# Patient Record
Sex: Female | Born: 2000 | Race: Black or African American | Hispanic: No | Marital: Single | Smoking: Never smoker
Health system: Southern US, Community
[De-identification: ages and names within clinical notes are randomized; demographics above are authoritative.]

## PROBLEM LIST (undated history)

## (undated) DIAGNOSIS — F419 Anxiety disorder, unspecified: Secondary | ICD-10-CM

## (undated) DIAGNOSIS — R519 Headache, unspecified: Secondary | ICD-10-CM

## (undated) HISTORY — PX: NO PAST SURGERIES: SHX2092

## (undated) HISTORY — DX: Headache, unspecified: R51.9

## (undated) HISTORY — PX: OTHER SURGICAL HISTORY: SHX169

## (undated) HISTORY — DX: Anxiety disorder, unspecified: F41.9

---
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
## 2021-02-06 DIAGNOSIS — N760 Acute vaginitis: Secondary | ICD-10-CM | POA: Diagnosis not present

## 2021-03-22 DIAGNOSIS — Z0000 Encounter for general adult medical examination without abnormal findings: Secondary | ICD-10-CM | POA: Diagnosis not present

## 2021-03-22 DIAGNOSIS — Z025 Encounter for examination for participation in sport: Secondary | ICD-10-CM | POA: Diagnosis not present

## 2021-03-23 ENCOUNTER — Inpatient Hospital Stay (HOSPITAL_COMMUNITY)
Admission: AD | Admit: 2021-03-23 | Discharge: 2021-03-23 | Disposition: A | Discharge status: Home / Self Care | Payer: Medicaid Other | Attending: Obstetrics and Gynecology | Admitting: Obstetrics and Gynecology

## 2021-03-23 ENCOUNTER — Other Ambulatory Visit: Payer: Self-pay

## 2021-03-23 ENCOUNTER — Ambulatory Visit (HOSPITAL_COMMUNITY)
Admission: EM | Admit: 2021-03-23 | Discharge: 2021-03-23 | Disposition: A | Discharge status: Home / Self Care | Payer: Medicaid Other | Attending: Medical Oncology | Admitting: Medical Oncology

## 2021-03-23 ENCOUNTER — Encounter (HOSPITAL_COMMUNITY): Payer: Self-pay

## 2021-03-23 DIAGNOSIS — Z3A00 Weeks of gestation of pregnancy not specified: Secondary | ICD-10-CM | POA: Insufficient documentation

## 2021-03-23 DIAGNOSIS — Z3201 Encounter for pregnancy test, result positive: Secondary | ICD-10-CM

## 2021-03-23 DIAGNOSIS — Z113 Encounter for screening for infections with a predominantly sexual mode of transmission: Secondary | ICD-10-CM | POA: Diagnosis present

## 2021-03-23 DIAGNOSIS — Z3401 Encounter for supervision of normal first pregnancy, first trimester: Secondary | ICD-10-CM | POA: Diagnosis present

## 2021-03-23 DIAGNOSIS — Z7251 High risk heterosexual behavior: Secondary | ICD-10-CM | POA: Diagnosis not present

## 2021-03-23 DIAGNOSIS — Z3200 Encounter for pregnancy test, result unknown: Secondary | ICD-10-CM

## 2021-03-23 LAB — POC URINE PREG, ED: Preg Test, Ur: POSITIVE — AB

## 2021-03-23 NOTE — Discharge Instructions (Signed)
[facility]   [facility]             [address], [place], [place] [address] [phone]  [facility]                                                             [address], [address], [place], [place], [address] [phone]  [facility]                                    [address], [address], [place], [place], [address] [phone]  [facility] [place] [address], [address], [place], [place], [address] [phone]  [facility]                                 [address], [place], [place], [address] [phone]  [facility]                                    [address], [place], [place], [address] [phone]  [facility] [address], [address], [place], [place], [address]                              [facility] [address], [place], [place], [address] [phone]  [facility]         Phone: [phone]  [facility]      Phone: [phone]   [facility]      Phone: [phone]  [facility]         Phone: [phone]   [facility]    Phone: [phone]  [facility]      Phone: [phone]  [facility]     Phone: [phone]  [facility]        Phone: [phone]  [facility]      Phone: [phone]  

## 2021-03-23 NOTE — MAU Provider Note (Addendum)
Chief Complaint: Possible Pregnancy   Event Date/Time   First Provider Initiated Contact with Patient 03/23/21 1228      SUBJECTIVE HPI: Joy Dyer is a 20 y.o. female who presents to maternity admissions for pregnancy confirmation.  She had 2 positive pregnancy test at home.  She denies vaginal bleeding or abdominal pain today. This was an unplanned pregnancy and she is not sure on her next steps.  Would like resources for OB/GYN in the region.  No past medical history on file. No past surgical history on file. Social History   Socioeconomic History   Marital status: Single    Spouse name: Not on file   Number of children: Not on file   Years of education: Not on file   Highest education level: Not on file  Occupational History   Not on file  Tobacco Use   Smoking status: Never   Smokeless tobacco: Not on file  Substance and Sexual Activity   Alcohol use: No   Drug use: Not on file   Sexual activity: Not on file  Other Topics Concern   Not on file  Social History Narrative   Not on file   Social Determinants of Health   Financial Resource Strain: Not on file  Food Insecurity: Not on file  Transportation Needs: Not on file  Physical Activity: Not on file  Stress: Not on file  Social Connections: Not on file  Intimate Partner Violence: Not on file   No current facility-administered medications on file prior to encounter.   No current outpatient medications on file prior to encounter.   No Known Allergies  ROS:  Review of Systems  Respiratory:  Negative for shortness of breath.   Cardiovascular:  Negative for chest pain.  Gastrointestinal:  Negative for abdominal pain, nausea and vomiting.  Genitourinary:  Negative for pelvic pain and vaginal bleeding.  Neurological:  Negative for dizziness and light-headedness.   I have reviewed patient's Past Medical Hx, Surgical Hx, Family Hx, Social Hx, medications and allergies.   Physical Exam  Patient Vitals for the  past 24 hrs:  BP Temp Temp src Pulse Resp SpO2 Height Weight  03/23/21 1205 117/74 98.7 F (37.1 C) Oral 67 16 100 % 5' (1.524 m) 46.8 kg   Physical Exam Constitutional:      General: She is not in acute distress.    Appearance: Normal appearance. She is not ill-appearing.  HENT:     Head: Normocephalic and atraumatic.     Mouth/Throat:     Mouth: Mucous membranes are moist.  Eyes:     Extraocular Movements: Extraocular movements intact.  Cardiovascular:     Pulses: Normal pulses.  Abdominal:     General: There is no distension.     Palpations: Abdomen is soft.     Tenderness: There is no abdominal tenderness.  Skin:    General: Skin is warm and dry.  Neurological:     Mental Status: She is alert.  Psychiatric:        Mood and Affect: Mood normal.        Behavior: Behavior normal.    MDM Patient denies any concerning symptoms for ectopic. She has been advised that UPT will not be performed today strictly for pregnancy confirmation and that she may present to the [facility] during business hours for a pregnancy test.   ASSESSMENT 1. Encounter for supervision of normal first pregnancy in first trimester     PLAN Discharge home in stable condition First  trimester/ectopic precautions discussed Patient advised to follow-up with [facility] for pregnancy test, provided with list of [facility] providers to establish care if she desires to do so. Patient may return to MAU as needed or if her condition were to change or worsen   Allayne Stack, DO 03/23/2021 12:38 PM

## 2021-03-23 NOTE — ED Provider Notes (Addendum)
[facility]    CSN: [id number] Arrival date & time: 03/23/21  1318      History   Chief Complaint Chief Complaint  Patient presents with   Possible Pregnancy   std testing    HPI Joy Dyer is a 20 y.o. female.   HPI  Amenorrhea: Pt reports that her [facility] was on 02/26/2021 but it was light and only 4 days in length. She also reports that she had some breast tenderness and mild nausea for the past month. She would like a pregnancy test today as she took 2 last night which were positive. She states that this is a unplanned pregnancy and would be her first pregnancy. She is not taking a prenatal vitamin. NO pelvic pain, vaginal bleeding, dysuria, vaginal discharge.   History reviewed. No pertinent past medical history.  There are no problems to display for this patient.   History reviewed. No pertinent surgical history.  OB History   No obstetric history on file.      Home Medications    Prior to Admission medications   Not on File    Family History Family History  Problem Relation Age of Onset   Healthy Mother     Social History Social History   Tobacco Use   Smoking status: Never   Smokeless tobacco: Never  Vaping Use   Vaping Use: Former  Substance Use Topics   Alcohol use: No   Drug use: Never     Allergies   Patient has no known allergies.   Review of Systems Review of Systems  As stated above in HPI Physical Exam Triage Vital Signs ED Triage Vitals  Enc Vitals Group     BP 03/23/21 1415 120/70     Pulse Rate 03/23/21 1415 84     Resp 03/23/21 1415 16     Temp 03/23/21 1415 98.7 F (37.1 C)     Temp Source 03/23/21 1415 Oral     SpO2 03/23/21 1415 98 %     Weight --      Height --      Head Circumference --      Peak Flow --      Pain Score 03/23/21 1416 0     Pain Loc --      Pain Edu? --      Excl. in GC? --    No data found.  Updated Vital Signs BP 120/70 (BP Location: Left Arm)   Pulse 84   Temp 98.7 F  (37.1 C) (Oral)   Resp 16   LMP 02/26/2021   SpO2 98%   Physical Exam Vitals and nursing note reviewed.  Constitutional:      General: She is not in acute distress.    Appearance: Normal appearance. She is not ill-appearing, toxic-appearing or diaphoretic.  Cardiovascular:     Rate and Rhythm: Normal rate and regular rhythm.     Heart sounds: Normal heart sounds.  Pulmonary:     Effort: Pulmonary effort is normal.     Breath sounds: Normal breath sounds.  Abdominal:     General: Bowel sounds are normal. There is no distension.     Palpations: Abdomen is soft. There is no mass.     Tenderness: There is no abdominal tenderness. There is no guarding or rebound.     Hernia: No hernia is present.  Neurological:     Mental Status: She is alert.     UC Treatments / Results  Labs (all  labs ordered are listed, but only abnormal results are displayed) Labs Reviewed  CERVICOVAGINAL ANCILLARY ONLY    EKG   Radiology No results found.  Procedures Procedures (including critical care time)  Medications Ordered in UC Medications - No data to display  Initial Impression / Assessment and Plan / UC Course  I have reviewed the triage vital signs and the nursing notes.  Pertinent labs & imaging results that were available during my care of the patient were reviewed by me and considered in my medical decision making (see chart for details).     New.  Her urine hCG is positive today.  I question if her bleeding last month was due to implantation.  I have encouraged her to start a prenatal vitamin and refer her to OB[facility]GYN or [facility] for [facility] and prenatal care/services.  Discussed first trimester handout.  Discussed red flag signs and symptoms. Final Clinical Impressions(s) / UC Diagnoses   Final diagnoses:  None   Discharge Instructions   None    ED Prescriptions   None    PDMP not reviewed this encounter.   Rushie Chestnut, PA-C 03/23/21 1438     Rushie Chestnut, PA-C 03/23/21 1447

## 2021-03-23 NOTE — MAU Note (Signed)
+  HPT x2. Came for confirmation and check up.  Had breast tenderness earlier than usual with period. Has had some mild cramps like she was going to start cycle. No pain today. No bleeding.

## 2021-03-23 NOTE — ED Triage Notes (Signed)
Pt in requesting pregnancy test after taking 2 at home test with positive results yesterday  Pt also requesting std testing

## 2021-03-25 LAB — CERVICOVAGINAL ANCILLARY ONLY
Bacterial Vaginitis (gardnerella): NEGATIVE
Candida Glabrata: NEGATIVE
Candida Vaginitis: NEGATIVE
Chlamydia: NEGATIVE
Comment: NEGATIVE
Comment: NEGATIVE
Comment: NEGATIVE
Comment: NEGATIVE
Comment: NEGATIVE
Comment: NORMAL
Neisseria Gonorrhea: NEGATIVE
Trichomonas: NEGATIVE

## 2021-04-16 ENCOUNTER — Ambulatory Visit (INDEPENDENT_AMBULATORY_CARE_PROVIDER_SITE_OTHER): Payer: Medicaid Other

## 2021-04-16 ENCOUNTER — Other Ambulatory Visit: Payer: Self-pay | Admitting: *Deleted

## 2021-04-16 ENCOUNTER — Ambulatory Visit (INDEPENDENT_AMBULATORY_CARE_PROVIDER_SITE_OTHER): Payer: Medicaid Other | Admitting: *Deleted

## 2021-04-16 ENCOUNTER — Other Ambulatory Visit: Payer: Self-pay

## 2021-04-16 VITALS — BP 121/75 | HR 60 | Wt 109.0 lb

## 2021-04-16 DIAGNOSIS — Z3201 Encounter for pregnancy test, result positive: Secondary | ICD-10-CM

## 2021-04-16 DIAGNOSIS — Z3401 Encounter for supervision of normal first pregnancy, first trimester: Secondary | ICD-10-CM

## 2021-04-16 DIAGNOSIS — Z3403 Encounter for supervision of normal first pregnancy, third trimester: Secondary | ICD-10-CM | POA: Insufficient documentation

## 2021-04-16 DIAGNOSIS — Z3200 Encounter for pregnancy test, result unknown: Secondary | ICD-10-CM

## 2021-04-16 DIAGNOSIS — Z3A01 Less than 8 weeks gestation of pregnancy: Secondary | ICD-10-CM | POA: Diagnosis not present

## 2021-04-16 DIAGNOSIS — Z3402 Encounter for supervision of normal first pregnancy, second trimester: Secondary | ICD-10-CM | POA: Insufficient documentation

## 2021-04-16 MED ORDER — PROMETHAZINE HCL 25 MG PO TABS: 25.0000 mg | ORAL_TABLET | Freq: Four times a day (QID) | ORAL | 2 refills | Status: DC | PRN

## 2021-04-16 MED ORDER — DOXYLAMINE-PYRIDOXINE 10-10 MG PO TBEC: 2.0000 | DELAYED_RELEASE_TABLET | Freq: Every day | ORAL | 5 refills | Status: DC

## 2021-04-16 NOTE — Progress Notes (Signed)
Patient was assessed and managed by nursing staff during this encounter. I have reviewed the chart and agree with the documentation and plan.   Raetta Agostinelli, MD 04/16/2021 12:51 PM 

## 2021-04-16 NOTE — Progress Notes (Signed)
Patient informed that the ultrasound is considered a limited obstetric ultrasound and is not intended to be a complete ultrasound exam.  Patient also informed that the ultrasound is not being completed with the intent of assessing for fetal or placental anomalies or any pelvic abnormalities. Explained that the purpose of today's ultrasound is to assess for fetal heart rate.  Patient acknowledges the purpose of the exam and the limitations of the study.     New OB Intake   I explained I am completing New OB Intake today. We discussed her EDD of 12/02/2021 that is based on LMP of 02/25/2021. Pt is G1/P0. I reviewed her allergies, medications, Medical/Surgical/OB history, and appropriate screenings.     There are no problems to display for this patient.   Concerns addressed today  Delivery Plans:  Plans to deliver at [facility].   MyChart/Babyscripts MyChart access verified. I explained pt will have some visits in office and some virtually. Babyscripts instructions given and order placed. Patient verifies receipt of registration text/e-mail. Account successfully created and app downloaded.  Labs Discussed Avelina Laine genetic screening with patient. Routine prenatal labs needed.    Placed OB Box on problem list and updated  First visit review I reviewed new OB appt with pt. I explained she will have ob bloodwork. Explained pt will be seen by Dr Macon Large at first visit; encounter routed to appropriate provider. Explained that patient will be seen by pregnancy navigator following visit with provider.   Scheryl Marten, RN 04/16/2021  11:31 AM

## 2021-04-16 NOTE — Progress Notes (Signed)
Joy Dyer [facility] does not have meds in stock, pt requesting them to be [facility] a [facility]

## 2021-05-22 ENCOUNTER — Other Ambulatory Visit: Payer: Self-pay

## 2021-05-22 ENCOUNTER — Ambulatory Visit (INDEPENDENT_AMBULATORY_CARE_PROVIDER_SITE_OTHER): Payer: Medicaid Other | Admitting: Obstetrics & Gynecology

## 2021-05-22 ENCOUNTER — Encounter: Payer: Self-pay | Admitting: Obstetrics & Gynecology

## 2021-05-22 ENCOUNTER — Other Ambulatory Visit (HOSPITAL_COMMUNITY)
Admission: RE | Admit: 2021-05-22 | Discharge: 2021-05-22 | Disposition: A | Discharge status: Home / Self Care | Payer: Medicaid Other | Source: Ambulatory Visit | Attending: Obstetrics & Gynecology | Admitting: Obstetrics & Gynecology

## 2021-05-22 VITALS — BP 122/76 | HR 96 | Wt 109.0 lb

## 2021-05-22 DIAGNOSIS — O219 Vomiting of pregnancy, unspecified: Secondary | ICD-10-CM

## 2021-05-22 DIAGNOSIS — Z3481 Encounter for supervision of other normal pregnancy, first trimester: Secondary | ICD-10-CM | POA: Diagnosis not present

## 2021-05-22 DIAGNOSIS — Z31430 Encounter of female for testing for genetic disease carrier status for procreative management: Secondary | ICD-10-CM | POA: Diagnosis not present

## 2021-05-22 DIAGNOSIS — Z3A12 12 weeks gestation of pregnancy: Secondary | ICD-10-CM

## 2021-05-22 DIAGNOSIS — Z3401 Encounter for supervision of normal first pregnancy, first trimester: Secondary | ICD-10-CM | POA: Insufficient documentation

## 2021-05-22 MED ORDER — METOCLOPRAMIDE HCL 10 MG PO TABS: 10.0000 mg | ORAL_TABLET | Freq: Four times a day (QID) | ORAL | 2 refills | Status: DC | PRN

## 2021-05-22 NOTE — Progress Notes (Signed)
History:   Joy Dyer is a 20 y.o. G1P0 at [redacted]w[redacted]d by LMP, early ultrasound being seen today for her first obstetrical visit.   Patient does intend to breast feed. Pregnancy history fully reviewed.  Patient reports nausea and vomiting. Desires non-drowsy medication. Also reportsa lesion on her labia, wants it evaluated. Was evaluated in school by RN, thought to be ingrown hair follicle.      HISTORY: OB History  Gravida Para Term Preterm AB Living  1 0 0 0 0 0  SAB IAB Ectopic Multiple Live Births  0 0 0 0 0    # Outcome Date GA Lbr Len/2nd Weight Sex Delivery Anes PTL Lv  1 Current             Past Medical History:  Diagnosis Date   Headache    History reviewed. No pertinent surgical history. Family History  Problem Relation Age of Onset   Aneurysm Mother    Hypertension Father    Diabetes Father    Healthy Sister    Healthy Brother    Healthy Maternal Aunt    Aneurysm Maternal Grandmother    Stroke Maternal Grandfather    Cancer Maternal Grandfather    Healthy Paternal Grandmother    Social History   Tobacco Use   Smoking status: Never   Smokeless tobacco: Never  Vaping Use   Vaping Use: Former  Substance Use Topics   Alcohol use: Not Currently   Drug use: Not Currently   No Known Allergies Current Outpatient Medications on File Prior to Visit  Medication Sig Dispense Refill   Doxylamine-Pyridoxine (DICLEGIS) 10-10 MG TBEC Take 2 tablets by mouth at bedtime. If symptoms persist, add one tablet in the morning and one in the afternoon 100 tablet 5   Prenatal Vit-Fe Fumarate-FA (PRENATAL MULTIVITAMIN) TABS tablet Take 1 tablet by mouth daily at 12 noon.     promethazine (PHENERGAN) 25 MG tablet Take 1 tablet (25 mg total) by mouth every 6 (six) hours as needed for nausea or vomiting. 30 tablet 2   No current facility-administered medications on file prior to visit.    Review of Systems Pertinent items noted in HPI and remainder of comprehensive ROS  otherwise negative.  Physical Exam:   Vitals:   05/22/21 1426  BP: 122/76  Pulse: 96  Weight: 109 lb (49.4 kg)    Fetal heart rate: 148 bpm General: well-developed, well-nourished female in no acute distress  Breasts:  deferred  Skin: normal coloration and turgor, no rashes  Neurologic: oriented, normal, negative, normal mood  Extremities: normal strength, tone, and muscle mass, ROM of all joints is normal  HEENT PERRLA, extraocular movement intact and sclera clear, anicteric  Neck supple and no masses  Cardiovascular: regular rate and rhythm  Respiratory:  no respiratory distress, normal breath sounds  Abdomen: soft, non-tender; bowel sounds normal; no masses,  no organomegaly  Pelvic: normal external genitalia, healed folliculitis lesion noted on left labium majus, no erythema, no drainage; no other lesions.     Assessment:    Pregnancy: G1P0 Patient Active Problem List   Diagnosis Date Noted   Encounter for supervision of normal first pregnancy in first trimester 04/16/2021     Plan:    1. Nausea/vomiting in pregnancy Reglan prescribed - metoCLOPramide (REGLAN) 10 MG tablet; Take 1 tablet (10 mg total) by mouth 4 (four) times daily as needed for nausea or vomiting.  Dispense: 30 tablet; Refill: 2  2. [redacted] weeks gestation of pregnancy 3.  Encounter for supervision of normal first pregnancy in first trimester - CBC/D/Plt+RPR+Rh+ABO+RubIgG... - Culture, OB Urine - Genetic Screening - GC/Chlamydia probe amp ([place])not at [facility] - [place] MFM OB COMP + 14 WK; Future - Enroll Patient in PreNatal Babyscripts - Babyscripts Schedule Optimization Initial labs drawn. Continue prenatal vitamins. Problem list reviewed and updated. Genetic Screening discussed, NIPS: ordered. Ultrasound discussed; fetal anatomic survey: ordered. Anticipatory guidance about prenatal visits given including labs, ultrasounds, and testing. Discussed usage of Babyscripts and virtual visits as  additional source of managing and completing prenatal visits in midst of coronavirus and pandemic.   Encouraged to complete MyChart Registration for her ability to review results, send requests, and have questions addressed.  The nature of [facility] with multiple MDs and Advanced Practice Providers was explained to patient; also emphasized that residents, students are part of our team. Routine obstetric precautions reviewed. Encouraged to seek out care at office or emergency room [facility] MAU preferred) for urgent and/or emergent concerns. Return in about 4 weeks (around 06/19/2021) for OFFICE OB VISIT (MD or APP).     Jaynie Collins, MD, FACOG Obstetrician & Gynecologist, [facility] for [employer], [facility]

## 2021-05-22 NOTE — Patient Instructions (Signed)
First Trimester of Pregnancy °The first trimester of pregnancy starts on the first day of your last menstrual period until the end of week 12. This is months 1 through 3 of pregnancy. A week after a sperm fertilizes an egg, the egg will implant into the wall of the uterus and begin to develop into a baby. By the end of 12 weeks, all the baby's organs will be formed and the baby will be 2-3 inches in size. °Body changes during your first trimester °Your body goes through many changes during pregnancy. The changes vary and generally return to normal after your baby is born. °Physical changes °You may gain or lose weight. °Your breasts may begin to grow larger and become tender. The tissue that surrounds your nipples (areola) may become darker. °Dark spots or blotches (chloasma or mask of pregnancy) may develop on your face. °You may have changes in your hair. These can include thickening or thinning of your hair or changes in texture. °Health changes °You may feel nauseous, and you may vomit. °You may have heartburn. °You may develop headaches. °You may develop constipation. °Your gums may bleed and may be sensitive to brushing and flossing. °Other changes °You may tire easily. °You may urinate more often. °Your menstrual periods will stop. °You may have a loss of appetite. °You may develop cravings for certain kinds of food. °You may have changes in your emotions from day to day. °You may have more vivid and strange dreams. °Follow these instructions at home: °Medicines °Follow your health care provider's instructions regarding medicine use. Specific medicines may be either safe or unsafe to take during pregnancy. Do not take any medicines unless told to by your health care provider. °Take a prenatal vitamin that contains at least 600 micrograms (mcg) of folic acid. °Eating and drinking °Eat a healthy diet that includes fresh fruits and vegetables, whole grains, good sources of protein such as meat, eggs, or tofu,  and low-fat dairy products. °Avoid raw meat and unpasteurized juice, milk, and cheese. These carry germs that can harm you and your baby. °If you feel nauseous or you vomit: °Eat 4 or 5 small meals a day instead of 3 large meals. °Try eating a few soda crackers. °Drink liquids between meals instead of during meals. °You may need to take these actions to prevent or treat constipation: °Drink enough fluid to keep your urine pale yellow. °Eat foods that are high in fiber, such as beans, whole grains, and fresh fruits and vegetables. °Limit foods that are high in fat and processed sugars, such as fried or sweet foods. °Activity °Exercise only as directed by your health care provider. Most people can continue their usual exercise routine during pregnancy. Try to exercise for 30 minutes at least 5 days a week. °Stop exercising if you develop pain or cramping in the lower abdomen or lower back. °Avoid exercising if it is very hot or humid or if you are at high altitude. °Avoid heavy lifting. °If you choose to, you may have sex unless your health care provider tells you not to. °Relieving pain and discomfort °Wear a good support bra to relieve breast tenderness. °Rest with your legs elevated if you have leg cramps or low back pain. °If you develop bulging veins (varicose veins) in your legs: °Wear support hose as told by your health care provider. °Elevate your feet for 15 minutes, 3-4 times a day. °Limit salt in your diet. °Safety °Wear your seat belt at all times when driving   or riding in a car. °Talk with your health care provider if someone is verbally or physically abusive to you. °Talk with your health care provider if you are feeling sad or have thoughts of hurting yourself. °Lifestyle °Do not use hot tubs, steam rooms, or saunas. °Do not douche. Do not use tampons or scented sanitary pads. °Do not use herbal remedies, alcohol, illegal drugs, or medicines that are not approved by your health care provider. Chemicals  in these products can harm your baby. °Do not use any products that contain nicotine or tobacco, such as cigarettes, e-cigarettes, and chewing tobacco. If you need help quitting, ask your health care provider. °Avoid cat litter boxes and soil used by cats. These carry germs that can cause birth defects in the baby and possibly loss of the unborn baby (fetus) by miscarriage or stillbirth. °General instructions °During routine prenatal visits in the first trimester, your health care provider will do a physical exam, perform necessary tests, and ask you how things are going. Keep all follow-up visits. This is important. °Ask for help if you have counseling or nutritional needs during pregnancy. Your health care provider can offer advice or refer you to specialists for help with various needs. °Schedule a dentist appointment. At home, brush your teeth with a soft toothbrush. Floss gently. °Write down your questions. Take them to your prenatal visits. °Where to find more information °American Pregnancy Association: americanpregnancy.org °American College of Obstetricians and Gynecologists: acog.org/en/Womens%20Health/Pregnancy °Office on Women's Health: womenshealth.gov/pregnancy °Contact a health care provider if you have: °Dizziness. °A fever. °Mild pelvic cramps, pelvic pressure, or nagging pain in the abdominal area. °Nausea, vomiting, or diarrhea that lasts for 24 hours or longer. °A bad-smelling vaginal discharge. °Pain when you urinate. °Known exposure to a contagious illness, such as chickenpox, measles, Zika virus, HIV, or hepatitis. °Get help right away if you have: °Spotting or bleeding from your vagina. °Severe abdominal cramping or pain. °Shortness of breath or chest pain. °Any kind of trauma, such as from a fall or a car crash. °New or increased pain, swelling, or redness in an arm or leg. °Summary °The first trimester of pregnancy starts on the first day of your last menstrual period until the end of week  12 (months 1 through 3). °Eating 4 or 5 small meals a day rather than 3 large meals may help to relieve nausea and vomiting. °Do not use any products that contain nicotine or tobacco, such as cigarettes, e-cigarettes, and chewing tobacco. If you need help quitting, ask your health care provider. °Keep all follow-up visits. This is important. °This information is not intended to replace advice given to you by your health care provider. Make sure you discuss any questions you have with your health care provider. °Document Revised: 01/12/2020 Document Reviewed: 11/18/2019 °Elsevier Patient Education © 2022 [employer] ° °

## 2021-05-23 LAB — CBC/D/PLT+RPR+RH+ABO+RUBIGG...
Antibody Screen: NEGATIVE
Basophils Absolute: 0 10*3/uL (ref 0.0–0.2)
Basos: 0 %
EOS (ABSOLUTE): 0.4 10*3/uL (ref 0.0–0.4)
Eos: 3 %
HCV Ab: <0.1 s/co ratio (ref 0.0–0.9)
HIV Screen 4th Generation wRfx: NONREACTIVE
Hematocrit: 43.5 % (ref 34.0–46.6)
Hemoglobin: 14.9 g/dL (ref 11.1–15.9)
Hepatitis B Surface Ag: NEGATIVE
Immature Grans (Abs): 0 10*3/uL (ref 0.0–0.1)
Immature Granulocytes: 0 %
Lymphocytes Absolute: 2.1 10*3/uL (ref 0.7–3.1)
Lymphs: 19 %
MCH: 30.8 pg (ref 26.6–33.0)
MCHC: 34.3 g/dL (ref 31.5–35.7)
MCV: 90 fL (ref 79–97)
Monocytes Absolute: 0.5 10*3/uL (ref 0.1–0.9)
Monocytes: 4 %
Neutrophils Absolute: 8.2 10*3/uL — ABNORMAL HIGH (ref 1.4–7.0)
Neutrophils: 74 %
Platelets: 369 10*3/uL (ref 150–450)
RBC: 4.83 x10E6/uL (ref 3.77–5.28)
RDW: 12.2 % (ref 11.7–15.4)
RPR Ser Ql: NONREACTIVE
Rh Factor: POSITIVE
Rubella Antibodies, IGG: 5.67 index (ref >0.99)
WBC: 11.2 10*3/uL — ABNORMAL HIGH (ref 3.4–10.8)

## 2021-05-23 LAB — HCV INTERPRETATION

## 2021-05-24 LAB — GC/CHLAMYDIA PROBE AMP (~~LOC~~) NOT AT ARMC
Chlamydia: NEGATIVE
Comment: NEGATIVE
Comment: NORMAL
Neisseria Gonorrhea: NEGATIVE

## 2021-05-24 LAB — URINE CULTURE, OB REFLEX

## 2021-05-24 LAB — CULTURE, OB URINE

## 2021-05-29 ENCOUNTER — Encounter: Payer: Self-pay | Admitting: Obstetrics & Gynecology

## 2021-05-29 ENCOUNTER — Telehealth: Payer: Self-pay

## 2021-05-29 NOTE — Telephone Encounter (Signed)
Spoke to pt about her panorama results she is going to come and pick them up. I explained to her not to look at her labs that say genetic screening and told her the results would be at the office in the front

## 2021-06-01 ENCOUNTER — Encounter: Payer: Self-pay | Admitting: Radiology

## 2021-06-01 DIAGNOSIS — Z3491 Encounter for supervision of normal pregnancy, unspecified, first trimester: Secondary | ICD-10-CM | POA: Diagnosis not present

## 2021-06-01 DIAGNOSIS — Z3A13 13 weeks gestation of pregnancy: Secondary | ICD-10-CM | POA: Diagnosis not present

## 2021-06-01 DIAGNOSIS — N898 Other specified noninflammatory disorders of vagina: Secondary | ICD-10-CM | POA: Diagnosis not present

## 2021-06-21 ENCOUNTER — Ambulatory Visit (INDEPENDENT_AMBULATORY_CARE_PROVIDER_SITE_OTHER): Payer: Medicaid Other | Admitting: Obstetrics & Gynecology

## 2021-06-21 ENCOUNTER — Encounter: Payer: Self-pay | Admitting: Obstetrics & Gynecology

## 2021-06-21 ENCOUNTER — Other Ambulatory Visit: Payer: Self-pay

## 2021-06-21 ENCOUNTER — Other Ambulatory Visit (HOSPITAL_COMMUNITY)
Admission: RE | Admit: 2021-06-21 | Discharge: 2021-06-21 | Disposition: A | Discharge status: Home / Self Care | Payer: Medicaid Other | Source: Ambulatory Visit | Attending: Obstetrics & Gynecology | Admitting: Obstetrics & Gynecology

## 2021-06-21 VITALS — BP 114/61 | HR 60 | Wt 114.0 lb

## 2021-06-21 DIAGNOSIS — B3731 Acute candidiasis of vulva and vagina: Secondary | ICD-10-CM

## 2021-06-21 DIAGNOSIS — Z3A16 16 weeks gestation of pregnancy: Secondary | ICD-10-CM

## 2021-06-21 DIAGNOSIS — O26892 Other specified pregnancy related conditions, second trimester: Secondary | ICD-10-CM | POA: Diagnosis present

## 2021-06-21 DIAGNOSIS — N898 Other specified noninflammatory disorders of vagina: Secondary | ICD-10-CM | POA: Diagnosis present

## 2021-06-21 DIAGNOSIS — Z3402 Encounter for supervision of normal first pregnancy, second trimester: Secondary | ICD-10-CM

## 2021-06-21 NOTE — Patient Instructions (Signed)

## 2021-06-21 NOTE — Progress Notes (Signed)
   PRENATAL VISIT NOTE  Subjective:  Joy Dyer is a 20 y.o. G1P0 at [redacted]w[redacted]d being seen today for ongoing prenatal care.  She is currently monitored for the following issues for this low-risk pregnancy and has Encounter for supervision of normal first pregnancy in second trimester on their problem list.  Patient reports  increased vaginal discharge. No irritation. Previously treated for BV .  Contractions: Not present. Vag. Bleeding: None.   . Denies leaking of fluid.   The following portions of the patient's history were reviewed and updated as appropriate: allergies, current medications, past family history, past medical history, past social history, past surgical history and problem list.   Objective:   Vitals:   06/21/21 1054  BP: 114/61  Pulse: 60  Weight: 114 lb (51.7 kg)    Fetal Status: Fetal Heart Rate (bpm): 152         General:  Alert, oriented and cooperative. Patient is in no acute distress.  Skin: Skin is warm and dry. No rash noted.   Cardiovascular: Normal heart rate noted  Respiratory: Normal respiratory effort, no problems with respiration noted  Abdomen: Soft, gravid, appropriate for gestational age.  Pain/Pressure: Absent     Pelvic: Cervical exam deferred        Extremities: Normal range of motion.  Edema: None  Mental Status: Normal mood and affect. Normal behavior. Normal judgment and thought content.   Assessment and Plan:  Pregnancy: G1P0 at [redacted]w[redacted]d 1. Vaginal discharge during pregnancy in second trimester Discussed that increased discharge could be normal in pregnancy. Discussed proper vulvar hygiene practices. Self swab done, will follow up results and manage accordingly. - Cervicovaginal ancillary only  2. [redacted] weeks gestation of pregnancy 3. Encounter for supervision of normal first pregnancy in second trimester Low risk NIPS. AFP declined. Already scheduled for anatomy scan.  No other complaints or concerns.  Routine obstetric precautions  reviewed. Please refer to After Visit Summary for other counseling recommendations.   Return in about 4 weeks (around 07/19/2021) for OFFICE OB VISIT (MD or APP).  Future Appointments  Date Time Provider Department Center  07/09/2021  8:45 AM [facility]    Jaynie Collins, MD

## 2021-06-22 LAB — CERVICOVAGINAL ANCILLARY ONLY
Bacterial Vaginitis (gardnerella): NEGATIVE
Candida Glabrata: NEGATIVE
Candida Vaginitis: POSITIVE — AB
Chlamydia: NEGATIVE
Comment: NEGATIVE
Comment: NEGATIVE
Comment: NEGATIVE
Comment: NEGATIVE
Comment: NEGATIVE
Comment: NORMAL
Neisseria Gonorrhea: NEGATIVE
Trichomonas: NEGATIVE

## 2021-06-25 MED ORDER — TERCONAZOLE 0.8 % VA CREA: 1.0000 | TOPICAL_CREAM | Freq: Every day | VAGINAL | 0 refills | Status: DC

## 2021-06-25 NOTE — Addendum Note (Signed)
Addended by: Jaynie Collins A on: 06/25/2021 12:22 PM   Modules accepted: Orders

## 2021-06-26 ENCOUNTER — Other Ambulatory Visit: Payer: Self-pay | Admitting: *Deleted

## 2021-06-26 DIAGNOSIS — B3731 Acute candidiasis of vulva and vagina: Secondary | ICD-10-CM

## 2021-06-26 MED ORDER — TERCONAZOLE 0.8 % VA CREA: 1.0000 | TOPICAL_CREAM | Freq: Every day | VAGINAL | 0 refills | Status: DC

## 2021-06-26 NOTE — Progress Notes (Signed)
Pt wanting RX sent to another pharmacy.

## 2021-07-09 ENCOUNTER — Ambulatory Visit: Payer: Medicaid Other | Attending: Obstetrics & Gynecology

## 2021-07-09 ENCOUNTER — Other Ambulatory Visit: Payer: Self-pay

## 2021-07-09 DIAGNOSIS — Z3A12 12 weeks gestation of pregnancy: Secondary | ICD-10-CM

## 2021-07-09 DIAGNOSIS — Z3401 Encounter for supervision of normal first pregnancy, first trimester: Secondary | ICD-10-CM

## 2021-07-09 DIAGNOSIS — Z3402 Encounter for supervision of normal first pregnancy, second trimester: Secondary | ICD-10-CM | POA: Insufficient documentation

## 2021-07-17 ENCOUNTER — Ambulatory Visit (INDEPENDENT_AMBULATORY_CARE_PROVIDER_SITE_OTHER): Payer: Medicaid Other | Admitting: Obstetrics & Gynecology

## 2021-07-17 ENCOUNTER — Other Ambulatory Visit: Payer: Self-pay

## 2021-07-17 ENCOUNTER — Other Ambulatory Visit: Payer: Self-pay | Admitting: Obstetrics & Gynecology

## 2021-07-17 VITALS — BP 109/68 | HR 88 | Wt 119.0 lb

## 2021-07-17 DIAGNOSIS — R519 Headache, unspecified: Secondary | ICD-10-CM

## 2021-07-17 DIAGNOSIS — O26892 Other specified pregnancy related conditions, second trimester: Secondary | ICD-10-CM

## 2021-07-17 DIAGNOSIS — O99612 Diseases of the digestive system complicating pregnancy, second trimester: Secondary | ICD-10-CM

## 2021-07-17 DIAGNOSIS — K219 Gastro-esophageal reflux disease without esophagitis: Secondary | ICD-10-CM

## 2021-07-17 DIAGNOSIS — Z3A20 20 weeks gestation of pregnancy: Secondary | ICD-10-CM

## 2021-07-17 DIAGNOSIS — Z3402 Encounter for supervision of normal first pregnancy, second trimester: Secondary | ICD-10-CM

## 2021-07-17 MED ORDER — PANTOPRAZOLE SODIUM 20 MG PO TBEC: 20.0000 mg | DELAYED_RELEASE_TABLET | Freq: Every day | ORAL | 3 refills | Status: DC

## 2021-07-17 MED ORDER — BUTALBITAL-APAP-CAFFEINE 50-325-40 MG PO CAPS: 1.0000 | ORAL_CAPSULE | Freq: Four times a day (QID) | ORAL | 3 refills | Status: DC | PRN

## 2021-07-17 NOTE — Progress Notes (Signed)
PRENATAL VISIT NOTE  Subjective:  Joy Dyer is a 20 y.o. G1P0 at [redacted]w[redacted]d being seen today for ongoing prenatal care.  She is currently monitored for the following issues for this low-risk pregnancy and has Encounter for supervision of normal first pregnancy in second trimester on their problem list.  Patient reports headache and heartburn. Headaches not alleviated by extra strength Tylenol.   Contractions: Not present. Vag. Bleeding: None.  Movement: Present. Denies leaking of fluid.   The following portions of the patient's history were reviewed and updated as appropriate: allergies, current medications, past family history, past medical history, past social history, past surgical history and problem list.   Objective:   Vitals:   07/17/21 1147  BP: 109/68  Pulse: 88  Weight: 119 lb (54 kg)    Fetal Status:     Movement: Present     General:  Alert, oriented and cooperative. Patient is in no acute distress.  Skin: Skin is warm and dry. No rash noted.   Cardiovascular: Normal heart rate noted  Respiratory: Normal respiratory effort, no problems with respiration noted  Abdomen: Soft, gravid, appropriate for gestational age.  Pain/Pressure: Present     Pelvic: Cervical exam deferred        Extremities: Normal range of motion.  Edema: None  Mental Status: Normal mood and affect. Normal behavior. Normal judgment and thought content.   [place] MFM OB COMP + 14 WK  Result Date: 07/09/2021 ----------------------------------------------------------------------  OBSTETRICS REPORT                       (Signed Final 07/09/2021 10:05 am) ---------------------------------------------------------------------- Patient Info  ID #:       [id number]                          D.O.B.:  11-14-2000 (20 yrs)  Name:       Joy Dyer                     Visit Date: 07/09/2021 08:51 am ---------------------------------------------------------------------- Performed By  Attending:        Noralee Space MD         Ref. Address:     [address]                                                             [address]  Performed By:     Tommie Raymond BS,       [facility]                                                             [facility]  Referred By:      [facility] ---------------------------------------------------------------------- Orders  #  Description                           Code        Ordered By  1  [place] MFM OB COMP + 14 WK                [id number]    Jaynie Collins ----------------------------------------------------------------------  #  Order #                     Accession #                Episode #  1  [id number]                   [id number]                 [id number] ---------------------------------------------------------------------- Indications  [redacted] weeks gestation of pregnancy                Z3A.19  Encounter for antenatal screening for          Z36.3  malformations  LR NIPS ---------------------------------------------------------------------- Fetal Evaluation  Num Of Fetuses:         1  Fetal Heart Rate(bpm):  148  Cardiac Activity:       Observed  Presentation:           Cephalic  Placenta:               Posterior  P. Cord Insertion:      Visualized, central  Amniotic Fluid  AFI FV:      Within normal limits                              Largest Pocket(cm)                              5.4 ---------------------------------------------------------------------- Biometry  BPD:      44.3  mm     G. Age:  19w 3d         62  %    CI:        74.43   %    70 - 86                                                          FL/HC:      16.2   %    16.1 - 18.3  HC:       163   mm     G. Age:  19w 0d         38  %    HC/AC:      1.20        1.09 - 1.39  AC:      135.5  mm     G. Age:  19w 0d         41  %    FL/BPD:     59.6   %  FL:       26.4  mm      G.  Age:  18w 0d         10  %    FL/AC:      19.5   %    20 - 24  HUM:      26.6  mm     G. Age:  18w 3d         31  %  CER:      20.5  mm     G. Age:  19w 5d         66  %  NFT:       4.8  mm  LV:        6.7  mm  CM:        5.3  mm  Est. FW:     249  gm      0 lb 9 oz     19  % ---------------------------------------------------------------------- OB History  Blood Type:   B+  Gravidity:    1         Term:   0  Living:       0 ---------------------------------------------------------------------- Gestational Age  LMP:           19w 1d        Date:  02/25/21                 EDD:   12/02/21  U/S Today:     18w 6d                                        EDD:   12/04/21  Best:          19w 1d     Det. By:  LMP  (02/25/21)          EDD:   12/02/21 ---------------------------------------------------------------------- Anatomy  Cranium:               Appears normal         LVOT:                   Appears normal  Cavum:                 Appears normal         Aortic Arch:            Appears normal  Ventricles:            Appears normal         Ductal Arch:            Appears normal  Choroid Plexus:        Appears normal         Diaphragm:              Appears normal  Cerebellum:            Appears normal         Stomach:                Appears normal, left  sided  Posterior Fossa:       Appears normal         Abdomen:                Appears normal  Nuchal Fold:           Appears normal         Abdominal Wall:         Appears nml (cord                                                                        insert, abd wall)  Face:                  Appears normal         Cord Vessels:           Appears normal (3                         (orbits and profile)                           vessel cord)  Lips:                  Appears normal         Kidneys:                Appear normal  Palate:                Not well visualized    Bladder:                Appears  normal  Thoracic:              Appears normal         Spine:                  Appears normal  Heart:                 Appears normal         Upper Extremities:      Appears normal                         (4CH, axis, and                         situs)  RVOT:                  Appears normal         Lower Extremities:      Appears normal  Other:  Heels/feet and open hands/5th digits visualized. VC, 3VV and 3VTV          visualized.Lenses and Nasal bone visualized. Technically difficult due          to fetal position. ---------------------------------------------------------------------- Cervix Uterus Adnexa  Cervix  Length:           3.87  cm.  Normal appearance by transabdominal scan.  Uterus  No abnormality visualized.  Right Ovary  Within normal limits.  Left Ovary  Within normal limits.  Cul Tommi Rumps  Sac  No free fluid seen.  Adnexa  No abnormality visualized. ---------------------------------------------------------------------- Impression  G1 P0.  Patient is here for fetal anatomy scan.  On cell-free fetal DNA screening, the risks of fetal  aneuploidies are not increased .MSAFP screening showed  low risk for open-neural tube defects .  We performed fetal anatomy scan. No makers of  aneuploidies or fetal structural defects are seen. Fetal  biometry is consistent with her previously-established dates.  Amniotic fluid is normal and good fetal activity is seen.  Patient understands the limitations of ultrasound in detecting  fetal anomalies.  Patient is 5 feet tall and weighs 114 pounds.  If clinical exam shows uterine size date discrepancy,  ultrasound may be requested. ---------------------------------------------------------------------- Recommendations  Follow-up scans as clinically indicated. ----------------------------------------------------------------------                  Noralee Space, MD Electronically Signed Final Report   07/09/2021 10:05 am  ----------------------------------------------------------------------   Assessment and Plan:  Pregnancy: G1P0 at [redacted]w[redacted]d 1. Gastroesophageal reflux during pregnancy in second trimester, antepartum Protonix prescribed.  - pantoprazole (PROTONIX) 20 MG tablet; Take 1 tablet (20 mg total) by mouth daily.  Dispense: 30 tablet; Refill: 3  2. Headache in pregnancy, antepartum, second trimester Fioricet prescribed as needed. Encouraged adequate hydration. - Butalbital-APAP-Caffeine 50-325-40 MG capsule; Take 1-2 capsules by mouth every 6 (six) hours as needed for headache.  Dispense: 30 capsule; Refill: 3  3. [redacted] weeks gestation of pregnancy 4. Encounter for supervision of normal first pregnancy in second trimester Normal anatomy scan, declined AFP. Preterm labor symptoms and general obstetric precautions including but not limited to vaginal bleeding, contractions, leaking of fluid and fetal movement were reviewed in detail with the patient. Please refer to After Visit Summary for other counseling recommendations.   Return in about 4 weeks (around 08/14/2021) for OFFICE OB VISIT (MD or APP)  8 weeks:  3rd trimester labs (1 hr GTT), TDap, OFFICE OB VISIT.  Future Appointments  Date Time Provider Department Center  08/15/2021 10:55 AM [name], MD [facility]  09/11/2021  8:55 AM Lurlie Wigen, Jethro Bastos, MD [facility]    Jaynie Collins, MD

## 2021-08-02 ENCOUNTER — Encounter: Payer: Self-pay | Admitting: Obstetrics & Gynecology

## 2021-08-15 ENCOUNTER — Other Ambulatory Visit: Payer: Self-pay

## 2021-08-15 ENCOUNTER — Ambulatory Visit (INDEPENDENT_AMBULATORY_CARE_PROVIDER_SITE_OTHER): Payer: Medicaid Other | Admitting: Family Medicine

## 2021-08-15 VITALS — BP 105/61 | HR 70 | Wt 123.0 lb

## 2021-08-15 DIAGNOSIS — R55 Syncope and collapse: Secondary | ICD-10-CM

## 2021-08-15 DIAGNOSIS — Z3402 Encounter for supervision of normal first pregnancy, second trimester: Secondary | ICD-10-CM

## 2021-08-15 NOTE — Progress Notes (Signed)
° °  PRENATAL VISIT NOTE  Subjective:  Joy Dyer is a 20 y.o. G1P0 at [redacted]w[redacted]d being seen today for ongoing prenatal care.  She is currently monitored for the following issues for this low-risk pregnancy and has Encounter for supervision of normal first pregnancy in second trimester on their problem list.  Patient reports no complaints.  Contractions: Not present. Vag. Bleeding: None.  Movement: Present. Denies leaking of fluid.   The following portions of the patient's history were reviewed and updated as appropriate: allergies, current medications, past family history, past medical history, past social history, past surgical history and problem list.   Objective:   Vitals:   08/15/21 1117  BP: 105/61  Pulse: 70  Weight: 123 lb (55.8 kg)    Fetal Status: Fetal Heart Rate (bpm): 145 Fundal Height: 23 cm Movement: Present     General:  Alert, oriented and cooperative. Patient is in no acute distress.  Skin: Skin is warm and dry. No rash noted.   Cardiovascular: Normal heart rate noted  Respiratory: Normal respiratory effort, no problems with respiration noted  Abdomen: Soft, gravid, appropriate for gestational age.  Pain/Pressure: Present     Pelvic: Cervical exam deferred        Extremities: Normal range of motion.  Edema: None  Mental Status: Normal mood and affect. Normal behavior. Normal judgment and thought content.   Assessment and Plan:  Pregnancy: G1P0 at [redacted]w[redacted]d 1. Pre-syncope Given chest pain (substernal pressure) and near syncope with c/o palpitations-->will send to cards for formal w/u. - Ambulatory referral to Cardiology  2. Encounter for supervision of normal first pregnancy in second trimester Will need 28 wk labs and TDaP at next visit. Plan for 1 hour due to near syncope with lab draws--should not be fasting. Declines flu   Preterm labor symptoms and general obstetric precautions including but not limited to vaginal bleeding, contractions, leaking of fluid and  fetal movement were reviewed in detail with the patient. Please refer to After Visit Summary for other counseling recommendations.   Return in 4 weeks (on 09/12/2021) for 28 wk labs 1 hour only.  Future Appointments  Date Time Provider Department Center  09/11/2021  8:55 AM Anyanwu, Jethro Bastos, MD [facility]    [name], MD

## 2021-08-15 NOTE — Patient Instructions (Signed)

## 2021-08-19 NOTE — L&D Delivery Note (Signed)
OB[facility]N [facility] Delivery Note ? ?Joy Dyer is a 21 y.o. G1P0 s/p SVD at [redacted]w[redacted]d. She was admitted for induction of labor due to severe back/right lower abdominal pain with inability to rule out placental abruption at term.  ? ?ROM: 14h 65m with clear fluid ?GBS Status: Negative ?Maximum Maternal Temperature: 100.1 ? ?Labor Progress: ?Presented for induction of labor and progressed following cytotec, foley balloon, pitocin, AROM, and IUPC placement. ? ?Delivery Date/Time: 11/17/2021 19:24 ?Delivery: Called to room and patient was complete and pushing. Head delivered OP. No nuchal cord present. Shoulder and body delivered in usual fashion. Cord clamped x 2 and cut by FOB before 1 min. Infant taken to warmer due to initial concern for respiratory effort and tone. Cord blood drawn. Placenta delivered spontaneously with gentle cord traction. Fundus firm with massage and Pitocin. Labia, perineum, vagina, and cervix inspected inspected with 1st degree perineal tear which was repaired with 3-0 vicryl.  ? ?Placenta: Intact, 3-vessel cord, sent to L&D ?Complications: None ?Lacerations: 1st degree perineal laceration, repaired with 3-0 vicryl ?EBL: 250 cc  ?Analgesia: Epidural ? ?Postpartum Planning ?[ ]  message to sent to schedule follow-up  ?[ ]  vaccines UTD ? ?Infant: Girl  APGARs 7, 8  weight pending ? ? ? , MD ?FM PGY-1 ?11/17/2021, 7:56 PM ? ? ? ? ? ?

## 2021-08-25 ENCOUNTER — Encounter: Payer: Self-pay | Admitting: Family Medicine

## 2021-08-25 DIAGNOSIS — B3731 Acute candidiasis of vulva and vagina: Secondary | ICD-10-CM

## 2021-08-27 ENCOUNTER — Other Ambulatory Visit: Payer: Self-pay

## 2021-08-27 MED ORDER — TERCONAZOLE 0.8 % VA CREA: 1.0000 | TOPICAL_CREAM | Freq: Every day | VAGINAL | 0 refills | Status: DC

## 2021-08-30 ENCOUNTER — Ambulatory Visit (INDEPENDENT_AMBULATORY_CARE_PROVIDER_SITE_OTHER): Payer: BC Managed Care – PPO

## 2021-08-30 ENCOUNTER — Other Ambulatory Visit: Payer: Self-pay

## 2021-08-30 ENCOUNTER — Encounter: Payer: Self-pay | Admitting: Cardiology

## 2021-08-30 ENCOUNTER — Ambulatory Visit (INDEPENDENT_AMBULATORY_CARE_PROVIDER_SITE_OTHER): Payer: Medicaid Other | Admitting: Cardiology

## 2021-08-30 VITALS — BP 130/64 | HR 110 | Resp 99 | Ht 60.0 in | Wt 127.5 lb

## 2021-08-30 DIAGNOSIS — R002 Palpitations: Secondary | ICD-10-CM | POA: Diagnosis not present

## 2021-08-30 DIAGNOSIS — R000 Tachycardia, unspecified: Secondary | ICD-10-CM

## 2021-08-30 DIAGNOSIS — R55 Syncope and collapse: Secondary | ICD-10-CM

## 2021-08-30 DIAGNOSIS — R011 Cardiac murmur, unspecified: Secondary | ICD-10-CM

## 2021-08-30 NOTE — Patient Instructions (Addendum)
Medication Instructions:   Your physician recommends that you continue on your current medications as directed. Please refer to the Current Medication list given to you today.   *If you need a refill on your cardiac medications before your next appointment, please call your pharmacy*   Lab Work: None ordered  If you have labs (blood work) drawn today and your tests are completely normal, you will receive your results only by: MyChart Message (if you have MyChart) OR A paper copy in the mail If you have any lab test that is abnormal or we need to change your treatment, we will call you to review the results.   Testing/Procedures:  Echocardiogram - Your physician has requested that you have an echocardiogram in 2 and a half to 3 weeks. Echocardiography is a painless test that uses sound waves to create images of your heart. It provides your doctor with information about the size and shape of your heart and how well your hearts chambers and valves are working. This procedure takes approximately one hour. There are no restrictions for this procedure.   2. Your physician has recommended that you wear a Zio monitor for 14 days, this will be mailed to your home. This monitor is a medical device that records the hearts electrical activity. Doctors most often use these monitors to diagnose arrhythmias. Arrhythmias are problems with the speed or rhythm of the heartbeat. The monitor is a small device applied to your chest. You can wear one while you do your normal daily activities. While wearing this monitor if you have any symptoms to push the button and record what you felt. Once you have worn this monitor for the period of time provider prescribed (Usually 14 days), you will return the monitor device in the postage paid box. Once it is returned they will download the data collected and provide [place] with a report which the provider will then review and we will call you with those results. Important  tips:  Avoid showering during the first 24 hours of wearing the monitor. Avoid excessive sweating to help maximize wear time. Do not submerge the device, no hot tubs, and no swimming pools. Keep any lotions or oils away from the patch. After 24 hours you may shower with the patch on. Take brief showers with your back facing the shower head.  Do not remove patch once it has been placed because that will interrupt data and decrease adhesive wear time. Push the button when you have any symptoms and write down what you were feeling. Once you have completed wearing your monitor, remove and place into box which has postage paid and place in your outgoing mailbox.  If for some reason you have misplaced your box then call our office and we can provide another box and/or mail it off for you.        Follow-Up: At [facility], you and your health needs are our priority.  As part of our continuing mission to provide you with exceptional heart care, we have created designated Provider Care Teams.  These Care Teams include your primary Cardiologist (physician) and Advanced Practice Providers (APPs -  Physician Assistants and Nurse Practitioners) who all work together to provide you with the care you need, when you need it.  We recommend signing up for the patient portal called "MyChart".  Sign up information is provided on this After Visit Summary.  MyChart is used to connect with patients for Virtual Visits (Telemedicine).  Patients are able to  view lab/test results, encounter notes, upcoming appointments, etc.  Non-urgent messages can be sent to your provider as well.   To learn more about what you can do with MyChart, go to [email or web address].    Your next appointment:   6-8 week(s)  The format for your next appointment:   In Person  Provider:   You may see Bryan Lemma, MD or one of the following Advanced Practice Providers on your designated Care Team:   Nicolasa Ducking, NP Eula Listen, PA-C Cadence Fransico Michael, PA-C  :1}    Other Instructions N/A

## 2021-08-30 NOTE — Progress Notes (Signed)
Primary [facility] Provider: [facility] Cardiologist: None Electrophysiologist: None OB/GYN: Joy D[facility] D[facility] D[facility] as Joy Dyer Note: Chief Complaint  Patient presents with   New Patient (Initial Visit)    Patient c/o pre-syncope, lightheaded, anxiety, pounding in chest, chest tightness and shortness of breath.Medications reviewed by the patient verbally.    Near Syncope   Tachycardia   ===================================  ASSESSMENT/PLAN   Problem List Items Addressed This Visit       Cardiology Problems   Near syncope    At least 1 clear episode of near syncope, but other spells that were not as severe.  Very unlikely to have vertebral artery disease.  I suspect this is probably related to a vasovagal episode with dehydration baseline tachycardia.  Could very well be related to movement of the baby affecting IVC flow.  Plan: 14-day Zio patch monitor 2D echo Reassurance Aggressive hydration      Relevant Orders   EKG 12-Lead   LONG TERM MONITOR (3-14 DAYS)     Other   Sinus tachycardia by electrocardiogram    She has plenty reasons for having sinus tachycardia.  At baseline she is about 110 bpm.  No significant orthostatic changes.  She tells me that she has spells where she feels her heart rate going faster and sometimes beating irregular.  Mostly she just feels it beating hard and forceful.  I think that she is not adequately hydrating.  She says that she does not drink more than 50 to 60 ounces a day.  He is not drinking sodas medically.  No caffeine. She also has right to be tachycardic this far in pregnancy.  Plan: Stressed importance of adequate hydration.  Need to strive for at least 7880 ounces water a day. 14-day Zio patch monitor to exclude SVT or other arrhythmias.  I suspect that she simply has sinus tachycardia. Check 2D echocardiogram to exclude structural abnormality.         Systolic murmur - Primary    Probably Joy Dyer plan tachycardia and a pregnancy Joy Dyer with no previous documented heart disease.  However with her having some chest discomfort and near syncope, need to exclude structural abnormality.  Plan: 2D echo.      Relevant Orders   ECHOCARDIOGRAM COMPLETE   Other Visit Diagnoses     Palpitations       Relevant Orders   EKG 12-Lead   LONG TERM MONITOR (3-14 DAYS)       ===================================  HPI:    Joy Dyer is a G1 P0 26-week pregnant 21 y.o. female who is being seen today for the evaluation of Tachycardia, and Near Syncope at the request of Joy Bores, Dyer.  Joy Dyer was last seen on August 15, 2021 by Joy Dyer of a basically low risk pregnancy.  She noted having some chest discomfort heaviness/pressure associated with palpitations and near syncope.  Referred to cardiology.  Recent Hospitalizations:  ER visit 03/23/2021 amenorrhea.  Concern for positive pregnancy.  Referred to OB/GYN.  Reviewed  CV studies:    The following studies were reviewed today: (if available, images/films reviewed: From Epic Chart or Dyer Everywhere) None:  Interval History:   Joy Dyer presents here today for evaluation of near syncope and tachycardia. She had 1 specific episode of near syncope about a month ago while sitting in class.  She does not remember doing much of anything.  Does  not really recall any arrhythmias.  She just felt like she felt flushed and felt a pressure and tightness in her chest.  She felt very dizzy and lightheaded.  She is not sure if this happened when she stood up or prep was just sitting.  She did not actually lose consciousness, but felt very lightheaded. Since that initial episode, she has had the tightness and lightheadedness but has not felt like she is in a pass out.  She is able to take nice deep breaths and has been able to avoid getting to the stage of feeling  like she might pass out.  She also notes her heart rate being pretty fast at baseline, worse since she has been pregnant.  She says that when she feels the near syncopal type symptoms she feels her heart beating somewhat fast but just really forceful and hard.  When it feels like this is when she feels a pressure in her chest.  She is not sure which comes first.  She feels like it may be a panic attack but her panic attacks did not have a combination of tachycardia, chest pressure, dyspnea and near syncope.  This seems a little different.  She has had intermittent pressure sensation in her chest off and on for the last month or so, but it is not exertional, and is often worse with lying down or leaning forward.  No PND or orthopnea.  Trivial edema.  No TIA or amaurosis fugax.  REVIEWED OF SYSTEMS   Review of Systems  Constitutional:  Positive for malaise/fatigue (She feels very tired and worn out after her lightheaded spells).  HENT:  Negative for congestion and nosebleeds.   Respiratory: Negative.         Only notes shortness of breath during the episodes.  Cardiovascular:        Per HPI  Gastrointestinal:  Negative for blood in stool and melena.       She does have some nausea but no vomiting.  This was most notable when she had her first pill.  Genitourinary:  Positive for frequency. Negative for dysuria and hematuria.  Musculoskeletal: Negative.   Neurological:  Positive for dizziness (Per HPI). Negative for focal weakness, loss of consciousness (Just had 1 episode where she felt like she may pass out other episodes have not reached that level of intensity.) and weakness.  Psychiatric/Behavioral:  Negative for depression and memory loss. The patient is nervous/anxious. The patient does not have insomnia.    I have reviewed and (if needed) personally updated the patient's problem list, medications, allergies, past medical and surgical history, social and family history.   PAST MEDICAL  HISTORY   Past Medical History:  Diagnosis Date   Anxiety    Exacerbated by pregnancy; also excessive by grandfather in ICU.   Headache     PAST SURGICAL HISTORY   Past Surgical History:  Procedure Laterality Date   None      Immunization History  Administered Date(s) Administered   PFIZER Comirnaty(Gray Top)Covid-19 Tri-Sucrose Vaccine 09/20/2020   PFIZER(Purple Top)SARS-COV-2 Vaccination 08/30/2020    MEDICATIONS/ALLERGIES   Current Meds  Medication Sig   butalbital-acetaminophen-caffeine (FIORICET) 50-325-40 MG tablet TAKE 1 TABLET BY MOUTH EVERY 4 HOURS AS NEEDED   pantoprazole (PROTONIX) 20 MG tablet Take 1 tablet (20 mg total) by mouth daily.   Prenatal Vit-Fe Fumarate-FA (PRENATAL MULTIVITAMIN) TABS tablet Take 1 tablet by mouth daily at 12 noon.   terconazole (TERAZOL 3) 0.8 % vaginal cream Place 1  applicator vaginally at bedtime. Apply nightly for three nights.    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.  Off-and-on living with her boyfriend (who is the father of her unborn child).  She has significant anxiety about their relationship and about his ability to have maturity to be an active father.  She acknowledges that he has 48 year old boy and may not have the maturity to stick around.      She also has significant stress going on right now.  Her grandfather is in the ICU at [facility].  It sounds like he is probably being placed on comfort Dyer measures.  She is quite sad and under lots of stress.   Family History  Problem Relation Age of Onset   Aneurysm Mother    Hypertension Father    Diabetes Father    Healthy Sister    Healthy Brother    Aneurysm  Maternal Grandmother    Stroke Maternal Grandfather    Cancer Maternal Grandfather    Healthy Paternal Grandmother    Healthy Maternal Aunt     OBJCTIVE -PE, EKG, labs   Wt Readings from Last 3 Encounters:  08/30/21 127 lb 8 oz (57.8 kg) (48 %, Z= -0.04)*  08/15/21 123 lb (55.8 kg) (40 %, Z= -0.26)*  07/17/21 119 lb (54 kg) (32 %, Z= -0.47)*   * Growth percentiles are based on CDC (Girls, 2-20 Years) data.    Physical Exam: BP 130/64 (BP Location: Right Arm, Patient Position: Sitting, Cuff Size: Normal)    Pulse (!) 110    Resp (!) 99    Ht 5' (1.524 m)    Wt 127 lb 8 oz (57.8 kg)    LMP 02/25/2021    SpO2 99%    BMI 24.90 kg/m left arm pressure 130/68 mmHg. Orthostatic vitals: Supine 120/72 mmHg-heart rate 109; seated 108/72-116; initial standing 122/73-112; standing after 5 minutes 112/72-120 bpm.  Physical Exam Vitals reviewed.  Constitutional:      Appearance: Normal appearance. She is normal weight.     Comments: Frail 20 year old.  Clearly pregnant, but healthy-appearing.  HENT:     Head: Normocephalic and atraumatic.  Eyes:     Extraocular Movements: Extraocular movements intact.     Conjunctiva/sclera: Conjunctivae normal.     Pupils: Pupils are equal, round, and reactive to light.  Neck:     Vascular: No carotid bruit.  Cardiovascular:     Rate and Rhythm: Regular rhythm. Tachycardia present.     Pulses: Normal pulses.     Heart sounds: Murmur (Harsh 2/6 SEM at RUSB-neck) heard.    No friction rub. No gallop.  Pulmonary:     Effort: Pulmonary effort is normal. No respiratory distress.     Breath sounds: Normal breath sounds. No wheezing.  Chest:     Chest wall: No tenderness.  Abdominal:     Comments: Gravid uterus  Musculoskeletal:        General: Normal range of motion.     Cervical back: Normal range of motion and neck supple.  Skin:    General: Skin is warm and dry.  Coloration: Skin is not pale.  Neurological:     General: No focal deficit  present.     Mental Status: She is alert and oriented to person, place, and time.     Motor: No weakness.     Gait: Gait normal.  Psychiatric:        Mood and Affect: Mood normal.        Behavior: Behavior normal.        Thought Content: Thought content normal.        Judgment: Judgment normal.     Comments: Anxious.  Timid    Adult ECG Report  Rate: 110 bpm;  Rhythm: sinus tachycardia and nonspecific a ST-T wave changes. ; , otherwise normal axis, intervals durations.  Narrative Interpretation: Borderline EKG.  Recent Labs: Reviewed No results found for: CHOL, HDL, LDLCALC, LDLDIRECT, TRIG, CHOLHDL No results found for: CREATININE, BUN, NA, K, CL, CO2 CBC Latest Ref Rng & Units 05/22/2021  WBC 3.4 - 10.8 x10E3/uL 11.2(H)  Hemoglobin 11.1 - 15.9 g/dL 14.9  Hematocrit 34.0 - 46.6 % 43.5  Platelets 150 - 450 x10E3/uL 369    No results found for: HGBA1C No results found for: TSH  ==================================================  COVID-19 Education: The signs and symptoms of COVID-19 were discussed with the patient and how to seek Dyer for testing (follow up with PCP or arrange E-visit).    I spent a total of 36 minutes with the patient spent in direct patient consultation.  Additional time spent with chart review  / charting (studies, outside notes, etc): 15 min Total Time: 51 min  Current medicines are reviewed at length with the patient today.  (+/- concerns) none  This visit occurred during the SARS-CoV-2 public Dyer emergency.  Safety protocols were in place, including screening questions prior to the visit, additional usage of staff PPE, and extensive cleaning of exam room while observing appropriate contact time as indicated for disinfecting solutions.  Notice: This dictation was prepared with Dragon dictation along with smart phrase technology. Any transcriptional errors that result from this process are unintentional and may not be corrected upon review.   Studies  Ordered:  Orders Placed This Encounter  Procedures   LONG TERM MONITOR (3-14 DAYS)   EKG 12-Lead   ECHOCARDIOGRAM COMPLETE    Patient Instructions / Medication Changes & Studies & Tests Ordered   Patient Instructions  Medication Instructions:   Your physician recommends that you continue on your current medications as directed. Please refer to the Current Medication list given to you today.   *If you need a refill on your cardiac medications before your next appointment, please call your pharmacy*   Lab Work: None ordered  If you have labs (blood work) drawn today and your tests are completely normal, you will receive your results only by: [place] (if you have MyChart) OR A paper copy in the mail If you have any lab test that is abnormal or we need to change your treatment, we will call you to review the results.   Testing/Procedures:  Echocardiogram - Your physician has requested that you have an echocardiogram in 2 and a half to 3 weeks. Echocardiography is a painless test that uses sound waves to create images of your heart. It provides your doctor with information about the size and shape of your heart and how well your hearts chambers and valves are working. This procedure takes approximately one hour. There are no restrictions for this procedure.   2. Your physician has  recommended that you wear a Zio monitor for 14 days, this will be mailed to your home. T    Follow-Up: At [facility], you and your Dyer needs are our priority.  As part of our continuing mission to provide you with exceptional heart Dyer, we have created designated Provider Dyer Teams.  These Dyer Teams include your primary Cardiologist (physician) and Advanced Practice Providers (APPs -  Physician Assistants and Nurse Practitioners) who all work together to provide you with the Dyer you need, when you need it.  We recommend signing up for the patient portal called "MyChart".  Sign up  information is provided on this After Visit Summary.  MyChart is used to connect with patients for Virtual Visits (Telemedicine).  Patients are able to view lab/test results, encounter notes, upcoming appointments, etc.  Non-urgent messages can be sent to your provider as well.   To learn more about what you can do with MyChart, go to [email or web address].    Your next appointment:   6-8 week(s)  The format for your next appointment:   In Person  Provider:   You may see Glenetta Hew, Dyer or one of the following Advanced Practice Providers on your designated Dyer Team:   Murray Hodgkins, NP Christell Faith, PA-C Cadence Kathlen Mody, PA-C  :1}    Other Instructions N/A    Glenetta Hew, M.D., M.S. Interventional Cardiologist   Pager # [phone] Phone # [phone] [address] [place], [place] [address]   Thank you for choosing [facility] in [place]!!

## 2021-08-31 ENCOUNTER — Encounter: Payer: Self-pay | Admitting: Cardiology

## 2021-08-31 DIAGNOSIS — R55 Syncope and collapse: Secondary | ICD-10-CM

## 2021-08-31 DIAGNOSIS — R011 Cardiac murmur, unspecified: Secondary | ICD-10-CM

## 2021-08-31 DIAGNOSIS — R000 Tachycardia, unspecified: Secondary | ICD-10-CM | POA: Insufficient documentation

## 2021-08-31 HISTORY — DX: Cardiac murmur, unspecified: R01.1

## 2021-08-31 HISTORY — DX: Syncope and collapse: R55

## 2021-08-31 NOTE — Assessment & Plan Note (Signed)
Probably [name] plan tachycardia and a pregnancy Joy Dyer with no previous documented heart disease.  However with her having some chest discomfort and near syncope, need to exclude structural abnormality.  Plan: 2D echo.

## 2021-08-31 NOTE — Assessment & Plan Note (Addendum)
She has plenty reasons for having sinus tachycardia.  At baseline she is about 110 bpm.  No significant orthostatic changes.  She tells me that she has spells where she feels her heart rate going faster and sometimes beating irregular.  Mostly she just feels it beating hard and forceful.  I think that she is not adequately hydrating.  She says that she does not drink more than 50 to 60 ounces a day.  He is not drinking sodas medically.  No caffeine. She also has right to be tachycardic this far in pregnancy.  Plan: Stressed importance of adequate hydration.  Need to strive for at least 7880 ounces water a day.  14-day Zio patch monitor to exclude SVT or other arrhythmias.  I suspect that she simply has sinus tachycardia.  Check 2D echocardiogram to exclude structural abnormality.

## 2021-08-31 NOTE — Assessment & Plan Note (Signed)
At least 1 clear episode of near syncope, but other spells that were not as severe.  Very unlikely to have vertebral artery disease.  I suspect this is probably related to a vasovagal episode with dehydration baseline tachycardia.  Could very well be related to movement of the baby affecting IVC flow.  Plan:  14-day Zio patch monitor  2D echo  Reassurance  Aggressive hydration

## 2021-09-05 DIAGNOSIS — R002 Palpitations: Secondary | ICD-10-CM | POA: Diagnosis not present

## 2021-09-05 DIAGNOSIS — R55 Syncope and collapse: Secondary | ICD-10-CM | POA: Diagnosis not present

## 2021-09-11 ENCOUNTER — Encounter: Payer: Self-pay | Admitting: Obstetrics & Gynecology

## 2021-09-11 ENCOUNTER — Other Ambulatory Visit: Payer: Self-pay

## 2021-09-11 ENCOUNTER — Telehealth (INDEPENDENT_AMBULATORY_CARE_PROVIDER_SITE_OTHER): Payer: Medicaid Other | Admitting: Obstetrics & Gynecology

## 2021-09-11 DIAGNOSIS — Z3A28 28 weeks gestation of pregnancy: Secondary | ICD-10-CM

## 2021-09-11 DIAGNOSIS — Z3403 Encounter for supervision of normal first pregnancy, third trimester: Secondary | ICD-10-CM

## 2021-09-11 NOTE — Patient Instructions (Signed)
Return to office for any scheduled appointments. Call the office or go to the [facility]U at [facility] [place] if:  You begin to have strong, frequent contractions  Your water breaks.  Sometimes it is a big gush of fluid, sometimes it is just a trickle that keeps getting your panties wet or running down your legs  You have vaginal bleeding.  It is normal to have a small amount of spotting if your cervix was checked.   You do not feel your baby moving like normal.  If you do not, get something to eat and drink and lay down and focus on feeling your baby move.   If your baby is still not moving like normal, you should call the office or go to MAU.  Any other obstetric concerns.   

## 2021-09-11 NOTE — Progress Notes (Signed)
° °  OBSTETRICS PRENATAL VIRTUAL VISIT ENCOUNTER NOTE  Provider location: Center for [facility]   Patient location: Home  I connected with [name] on 09/11/21 at  8:55 AM EST by MyChart Video Encounter and verified that I am speaking with the correct person using two identifiers. I discussed the limitations, risks, security and privacy concerns of performing an evaluation and management service virtually and the availability of in person appointments. I also discussed with the patient that there may be a patient responsible charge related to this service. The patient expressed understanding and agreed to proceed. Subjective:  Joy Dyer is a 21 y.o. G1P0 at [redacted]w[redacted]d being seen today for ongoing prenatal care.  She is currently monitored for the following issues for this low-risk pregnancy and has Encounter for supervision of normal first pregnancy in third trimester; Sinus tachycardia by electrocardiogram; Near syncope; and Systolic murmur on their problem list.  Patient reports no complaints.  Contractions: Irritability. Vag. Bleeding: None.  Movement: Present. Denies any leaking of fluid.   The following portions of the patient's history were reviewed and updated as appropriate: allergies, current medications, past family history, past medical history, past social history, past surgical history and problem list.   Objective:  There were no vitals filed for this visit.  Fetal Status:     Movement: Present     General:  Alert, oriented and cooperative. Patient is in no acute distress.  Respiratory: Normal respiratory effort, no problems with respiration noted  Mental Status: Normal mood and affect. Normal behavior. Normal judgment and thought content.  Rest of physical exam deferred due to type of encounter  Imaging: No results found.  Assessment and Plan:  Pregnancy: G1P0 at [redacted]w[redacted]d 1. [redacted] weeks gestation of pregnancy 2. Encounter for supervision of normal first  pregnancy in third trimester Third trimester labs next visit (will get 1 hr GTT) Preterm labor symptoms and general obstetric precautions including but not limited to vaginal bleeding, contractions, leaking of fluid and fetal movement were reviewed in detail with the patient. I discussed the assessment and treatment plan with the patient. The patient was provided an opportunity to ask questions and all were answered. The patient agreed with the plan and demonstrated an understanding of the instructions. The patient was advised to call back or seek an in-person office evaluation/go to [facility] at [facility] for any urgent or concerning symptoms. Please refer to After Visit Summary for other counseling recommendations.   I provided 7 minutes of face-to-face time during this encounter.  Return in about 2 weeks (around 09/25/2021) for OB visits  as scheduled.  Future Appointments  Date Time Provider Department Center  09/19/2021  3:00 PM [facility] [place] [facility]  09/25/2021  2:30 PM [place] Bing, MD [facility]  10/09/2021  2:30 PM Eryca Bolte, Jethro Bastos, MD [facility]  10/16/2021  9:35 AM Daylyn Azbill, Jethro Bastos, MD [facility]  10/18/2021  3:40 PM Marykay Lex, MD [facility]  10/23/2021  9:35 AM Macon Large, Jethro Bastos, MD [facility]  11/06/2021  9:35 AM Macon Large, Jethro Bastos, MD [facility]  11/20/2021  9:35 AM Macon Large, Jethro Bastos, MD [facility]  11/27/2021  9:35 AM Macon Large, Jethro Bastos, MD [facility]  12/04/2021  9:35 AM Maurie Musco, Jethro Bastos, MD [facility]    Jaynie Collins, MD Center for [facility], [facility]

## 2021-09-12 ENCOUNTER — Encounter: Payer: Self-pay | Admitting: Obstetrics & Gynecology

## 2021-09-14 ENCOUNTER — Encounter: Payer: Self-pay | Admitting: Obstetrics & Gynecology

## 2021-09-18 ENCOUNTER — Encounter: Payer: Medicaid Other | Admitting: Obstetrics & Gynecology

## 2021-09-19 ENCOUNTER — Other Ambulatory Visit: Payer: Self-pay

## 2021-09-19 ENCOUNTER — Ambulatory Visit (INDEPENDENT_AMBULATORY_CARE_PROVIDER_SITE_OTHER): Payer: Medicaid Other

## 2021-09-19 DIAGNOSIS — R011 Cardiac murmur, unspecified: Secondary | ICD-10-CM

## 2021-09-19 LAB — ECHOCARDIOGRAM COMPLETE
AR max vel: 2.28 cm2
AV Area VTI: 2.03 cm2
AV Area mean vel: 2.21 cm2
AV Mean grad: 4 mmHg
AV Peak grad: 8.2 mmHg
Ao pk vel: 1.43 m/s
Area-P 1/2: 5.88 cm2
Calc EF: 65.9 %
S' Lateral: 3 cm
Single Plane A2C EF: 70.9 %
Single Plane A4C EF: 60.5 %

## 2021-09-20 ENCOUNTER — Telehealth: Payer: Self-pay

## 2021-09-20 NOTE — Telephone Encounter (Signed)
Able to reach pt regarding her recent ECHO, Dr. Ellyn Hack had a chance to review her results and advised   "Echocardiogram was normal.  Normal pump function.  Normal valves.   Nothing to explain murmur or any abnormalities to explain passing out.   Joy Hew, MD "  [name] very thankful for the phone call of her results, all questions and concerns were address with nothing further at this time. Will see at next schedule f/u appt.

## 2021-09-25 ENCOUNTER — Ambulatory Visit (INDEPENDENT_AMBULATORY_CARE_PROVIDER_SITE_OTHER): Payer: Medicaid Other | Admitting: Obstetrics and Gynecology

## 2021-09-25 ENCOUNTER — Other Ambulatory Visit: Payer: Self-pay

## 2021-09-25 VITALS — BP 116/76 | HR 91 | Wt 131.0 lb

## 2021-09-25 DIAGNOSIS — Z3403 Encounter for supervision of normal first pregnancy, third trimester: Secondary | ICD-10-CM

## 2021-09-25 DIAGNOSIS — Z3A30 30 weeks gestation of pregnancy: Secondary | ICD-10-CM

## 2021-09-25 MED ORDER — MAGNESIUM OXIDE 400 240 MG PO PACK: 1.0000 | PACK | Freq: Every day | ORAL | Status: DC | PRN

## 2021-09-25 NOTE — Progress Notes (Signed)
° °  PRENATAL VISIT NOTE  Subjective:  Joy Dyer is a 21 y.o. G1P0 at [redacted]w[redacted]d being seen today for ongoing prenatal care.  She is currently monitored for the following issues for this low-risk pregnancy and has Encounter for supervision of normal first pregnancy in third trimester; Sinus tachycardia by electrocardiogram; Near syncope; and Systolic murmur on their problem list.  Patient reports  using apap for a h/o headaches, she doesn't take in a lot of caffeine .  Contractions: Not present. Vag. Bleeding: None.  Movement: Absent. Denies leaking of fluid.   The following portions of the patient's history were reviewed and updated as appropriate: allergies, current medications, past family history, past medical history, past social history, past surgical history and problem list.   Objective:   Vitals:   09/25/21 1418  BP: 116/76  Pulse: 91  Weight: 131 lb (59.4 kg)    Fetal Status: Fetal Heart Rate (bpm): 156 Fundal Height: 31 cm Movement: Absent     General:  Alert, oriented and cooperative. Patient is in no acute distress.  Skin: Skin is warm and dry. No rash noted.   Cardiovascular: Normal heart rate noted  Respiratory: Normal respiratory effort, no problems with respiration noted  Abdomen: Soft, gravid, appropriate for gestational age.  Pain/Pressure: Absent     Pelvic: Cervical exam deferred        Extremities: Normal range of motion.  Edema: None  Mental Status: Normal mood and affect. Normal behavior. Normal judgment and thought content.   Assessment and Plan:  Pregnancy: G1P0 at [redacted]w[redacted]d 1. [redacted] weeks gestation of pregnancy Recommend d/c apap and try mg ox qday or prn  - Glucose tolerance, 1 hour - CBC - RPR - HIV antibody (with reflex)  Preterm labor symptoms and general obstetric precautions including but not limited to vaginal bleeding, contractions, leaking of fluid and fetal movement were reviewed in detail with the patient. Please refer to After Visit Summary for other  counseling recommendations.   Return in about 2 weeks (around 10/09/2021) for low risk ob, md or app, in person.  Future Appointments  Date Time Provider Department Center  10/09/2021  2:30 PM Tereso Newcomer, MD [facility]  10/16/2021  9:35 AM Anyanwu, Jethro Bastos, MD [facility]  10/18/2021  3:40 PM Marykay Lex, MD [facility]  10/23/2021  9:35 AM Macon Large, Jethro Bastos, MD [facility]  11/06/2021  9:35 AM Macon Large, Jethro Bastos, MD [facility]  11/20/2021  9:35 AM Macon Large, Jethro Bastos, MD [facility]  11/27/2021  9:35 AM Macon Large, Jethro Bastos, MD [facility]  12/04/2021  9:35 AM Anyanwu, Jethro Bastos, MD [facility]    [place] Bing, MD

## 2021-09-26 LAB — CBC
Hematocrit: 30.1 % — ABNORMAL LOW (ref 34.0–46.6)
Hemoglobin: 10.1 g/dL — ABNORMAL LOW (ref 11.1–15.9)
MCH: 29.1 pg (ref 26.6–33.0)
MCHC: 33.6 g/dL (ref 31.5–35.7)
MCV: 87 fL (ref 79–97)
Platelets: 348 10*3/uL (ref 150–450)
RBC: 3.47 x10E6/uL — ABNORMAL LOW (ref 3.77–5.28)
RDW: 11.6 % — ABNORMAL LOW (ref 11.7–15.4)
WBC: 10.5 10*3/uL (ref 3.4–10.8)

## 2021-09-26 LAB — HIV ANTIBODY (ROUTINE TESTING W REFLEX): HIV Screen 4th Generation wRfx: NONREACTIVE

## 2021-09-26 LAB — RPR: RPR Ser Ql: NONREACTIVE

## 2021-09-26 LAB — GLUCOSE TOLERANCE, 1 HOUR: Glucose, 1Hr PP: 115 mg/dL (ref 70–199)

## 2021-09-26 MED ORDER — FERROUS SULFATE 325 (65 FE) MG PO TABS: 325.0000 mg | ORAL_TABLET | ORAL | 0 refills | Status: DC

## 2021-09-26 MED ORDER — DOCUSATE SODIUM 100 MG PO CAPS: 100.0000 mg | ORAL_CAPSULE | Freq: Two times a day (BID) | ORAL | 2 refills | Status: DC | PRN

## 2021-09-27 ENCOUNTER — Encounter: Payer: Self-pay | Admitting: Obstetrics & Gynecology

## 2021-10-07 ENCOUNTER — Encounter: Payer: Self-pay | Admitting: Obstetrics & Gynecology

## 2021-10-09 ENCOUNTER — Ambulatory Visit (INDEPENDENT_AMBULATORY_CARE_PROVIDER_SITE_OTHER): Payer: Medicaid Other | Admitting: Obstetrics & Gynecology

## 2021-10-09 ENCOUNTER — Other Ambulatory Visit: Payer: Self-pay

## 2021-10-09 VITALS — BP 107/68 | HR 77 | Wt 131.0 lb

## 2021-10-09 DIAGNOSIS — Z3403 Encounter for supervision of normal first pregnancy, third trimester: Secondary | ICD-10-CM

## 2021-10-09 DIAGNOSIS — Z3A32 32 weeks gestation of pregnancy: Secondary | ICD-10-CM

## 2021-10-09 NOTE — Progress Notes (Signed)
° °  PRENATAL VISIT NOTE  Subjective:  Joy Dyer is a 21 y.o. G1P0 at [redacted]w[redacted]d being seen today for ongoing prenatal care.  She is currently monitored for the following issues for this low-risk pregnancy and has Encounter for supervision of normal first pregnancy in third trimester; Sinus tachycardia by electrocardiogram; Near syncope; and Systolic murmur on their problem list.  Patient reports no complaints.  Contractions: Irritability. Vag. Bleeding: None.  Movement: Present. Denies leaking of fluid.   The following portions of the patient's history were reviewed and updated as appropriate: allergies, current medications, past family history, past medical history, past social history, past surgical history and problem list.   Objective:   Vitals:   10/09/21 1457  BP: 107/68  Pulse: 77  Weight: 131 lb (59.4 kg)    Fetal Status: Fetal Heart Rate (bpm): 134 Fundal Height: 32 cm Movement: Present     General:  Alert, oriented and cooperative. Patient is in no acute distress.  Skin: Skin is warm and dry. No rash noted.   Cardiovascular: Normal heart rate noted  Respiratory: Normal respiratory effort, no problems with respiration noted  Abdomen: Soft, gravid, appropriate for gestational age.  Pain/Pressure: Absent     Pelvic: Cervical exam deferred        Extremities: Normal range of motion.  Edema: None  Mental Status: Normal mood and affect. Normal behavior. Normal judgment and thought content.   Assessment and Plan:  Pregnancy: G1P0 at [redacted]w[redacted]d 1. [redacted] weeks gestation of pregnancy 2. Encounter for supervision of normal first pregnancy in third trimester No concerns.  Preterm labor symptoms and general obstetric precautions including but not limited to vaginal bleeding, contractions, leaking of fluid and fetal movement were reviewed in detail with the patient. Please refer to After Visit Summary for other counseling recommendations.   Return in about 2 weeks (around 10/23/2021) for OB visits  as scheduled.  Future Appointments  Date Time Provider Department Center  10/18/2021  3:40 PM Leonie Man, MD [facility]  10/23/2021  9:35 AM Allyce Bochicchio, Sallyanne Havers, MD [facility]  11/06/2021  9:35 AM Harolyn Rutherford, Sallyanne Havers, MD [facility]  11/15/2021  2:30 PM Kyandre Okray, Sallyanne Havers, MD [facility]  11/20/2021  9:35 AM Harolyn Rutherford, Sallyanne Havers, MD [facility]  11/27/2021  9:35 AM Harolyn Rutherford, Sallyanne Havers, MD [facility]  12/04/2021  9:35 AM Equilla Que, Sallyanne Havers, MD [facility]    Verita Schneiders, MD

## 2021-10-09 NOTE — Patient Instructions (Addendum)

## 2021-10-10 ENCOUNTER — Encounter: Payer: Self-pay | Admitting: Obstetrics & Gynecology

## 2021-10-16 ENCOUNTER — Encounter: Payer: Medicaid Other | Admitting: Obstetrics & Gynecology

## 2021-10-18 ENCOUNTER — Ambulatory Visit (INDEPENDENT_AMBULATORY_CARE_PROVIDER_SITE_OTHER): Payer: Medicaid Other | Admitting: Cardiology

## 2021-10-18 ENCOUNTER — Other Ambulatory Visit: Payer: Self-pay

## 2021-10-18 ENCOUNTER — Telehealth: Payer: Self-pay | Admitting: Cardiology

## 2021-10-18 ENCOUNTER — Encounter: Payer: Self-pay | Admitting: Cardiology

## 2021-10-18 VITALS — Ht 60.0 in | Wt 131.0 lb

## 2021-10-18 DIAGNOSIS — I499 Cardiac arrhythmia, unspecified: Secondary | ICD-10-CM

## 2021-10-18 DIAGNOSIS — R55 Syncope and collapse: Secondary | ICD-10-CM

## 2021-10-18 DIAGNOSIS — R000 Tachycardia, unspecified: Secondary | ICD-10-CM | POA: Diagnosis not present

## 2021-10-18 DIAGNOSIS — R011 Cardiac murmur, unspecified: Secondary | ICD-10-CM | POA: Diagnosis not present

## 2021-10-18 HISTORY — DX: Cardiac arrhythmia, unspecified: I49.9

## 2021-10-18 NOTE — Assessment & Plan Note (Signed)
No further evidence of syncope or near syncope. ? ?I suspect again that she was likely dehydrated and had some vasovagal symptoms. ? ?Continue to suggest adequate hydration. ?80 to 100 ounces a day (roughly 3 L)-can drink rehydration drinks as well. ?

## 2021-10-18 NOTE — Assessment & Plan Note (Signed)
Normal echo.  Suspect flow murmur ?

## 2021-10-18 NOTE — Telephone Encounter (Signed)
?  Patient Consent for Virtual Visit  ? ? ?   ? ?Joy Dyer has provided verbal consent on 10/18/2021 for a virtual visit (video or telephone). ? ? ?CONSENT FOR VIRTUAL VISIT FOR:  Joy Dyer  ?By participating in this virtual visit I agree to the following: ? ?I hereby voluntarily request, consent and authorize [facility] and its employed or contracted physicians, physician assistants, nurse practitioners or other licensed health care professionals (the Practitioner), to provide me with telemedicine health care services (the ?Services") as deemed necessary by the treating Practitioner. I acknowledge and consent to receive the Services by the Practitioner via telemedicine. I understand that the telemedicine visit will involve communicating with the Practitioner through live audiovisual communication technology and the disclosure of certain medical information by electronic transmission. I acknowledge that I have been given the opportunity to request an in-person assessment or other available alternative prior to the telemedicine visit and am voluntarily participating in the telemedicine visit. ? ?I understand that I have the right to withhold or withdraw my consent to the use of telemedicine in the course of my care at any time, without affecting my right to future care or treatment, and that the Practitioner or I may terminate the telemedicine visit at any time. I understand that I have the right to inspect all information obtained and/or recorded in the course of the telemedicine visit and may receive copies of available information for a reasonable fee.  I understand that some of the potential risks of receiving the Services via telemedicine include:  ?Delay or interruption in medical evaluation due to technological equipment failure or disruption; ?Information transmitted may not be sufficient (e.g. poor resolution of images) to allow for appropriate medical decision making by the Practitioner; and/or  ?In  rare instances, security protocols could fail, causing a breach of personal health information. ? ?Furthermore, I acknowledge that it is my responsibility to provide information about my medical history, conditions and care that is complete and accurate to the best of my ability. I acknowledge that Practitioner's advice, recommendations, and/or decision may be based on factors not within their control, such as incomplete or inaccurate data provided by me or distortions of diagnostic images or specimens that may result from electronic transmissions. I understand that the practice of medicine is not an exact science and that Practitioner makes no warranties or guarantees regarding treatment outcomes. I acknowledge that a copy of this consent can be made available to me via my patient portal ([facility]), or I can request a printed copy by calling the office of [facility].   ? ?I understand that my insurance will be billed for this visit.  ? ?I have read or had this consent read to me. ?I understand the contents of this consent, which adequately explains the benefits and risks of the Services being provided via telemedicine.  ?I have been provided ample opportunity to ask questions regarding this consent and the Services and have had my questions answered to my satisfaction. ?I give my informed consent for the services to be provided through the use of telemedicine in my medical care ? ? ? ?

## 2021-10-18 NOTE — Patient Instructions (Signed)
Medication Instructions:  ?We will try to avoid medications for now ? ?*If you need a refill on your cardiac medications before your next appointment, please call your pharmacy* ? ? ?Lab Work: ?None ?If you have labs (blood work) drawn today and your tests are completely normal, you will receive your results only by: ?MyChart Message (if you have MyChart) OR ?A paper copy in the mail ?If you have any lab test that is abnormal or we need to change your treatment, we will call you to review the results. ? ? ?Testing/Procedures: ?No further testing ? ? ?Follow-Up: ?At [facility], you and your health needs are our priority.  As part of our continuing mission to provide you with exceptional heart care, we have created designated Provider Care Teams.  These Care Teams include your primary Cardiologist (physician) and Advanced Practice Providers (APPs -  Physician Assistants and Nurse Practitioners) who all work together to provide you with the care you need, when you need it. ? ?We recommend signing up for the patient portal called "MyChart".  Sign up information is provided on this After Visit Summary.  MyChart is used to connect with patients for Virtual Visits (Telemedicine).  Patients are able to view lab/test results, encounter notes, upcoming appointments, etc.  Non-urgent messages can be sent to your provider as well.   ?To learn more about what you can do with MyChart, go to [email or web address].   ? ?Your next appointment:   ?6 month(s) ? ?The format for your next appointment:   ?Can do in person or virtual ? ?Provider:   ?Bryan Lemma, MD  ? ? ?Other Instructions ?Make sure you continue to stay hydrated that means drinking anywhere from 80 to 100 ounces a day of water or rehydration drinks such as Gatorade/Powerade-however would recommend using the low sugar versions.  You can also just do drink packets for flavor. ?

## 2021-10-18 NOTE — Assessment & Plan Note (Signed)
Resting heart rate averages 94 bpm which is not abnormal for being pregnant.  She did have an episode where she went to the 180s and sinus tachycardia but was not symptomatic.  She was upset. ? ?Again continue to recommend adequate hydration, avoid sodas/caffeine. ?

## 2021-10-18 NOTE — Progress Notes (Signed)
Virtual Visit via Telephone Note   This visit type was conducted due to national recommendations for restrictions regarding the COVID-19 Pandemic (e.g. social distancing) in an effort to limit this patient's exposure and mitigate transmission in our community.  Due to her co-morbid illnesses, this patient is at least at moderate risk for complications without adequate follow up.  This format is felt to be most appropriate for this patient at this time.  The patient did not have access to video technology/had technical difficulties with video requiring transitioning to audio format only (telephone).  All issues noted in this document were discussed and addressed.  No physical exam could be performed with this format.  Please refer to the patient's chart for her  consent to telehealth for [facility].   Patient has given verbal permission to conduct this visit via virtual appointment and to bill insurance 10/18/2021 5:36 PM     Evaluation Performed:  Follow-up visit  Date:  10/18/2021   ID:  Joy Dyer, DOB 2000-11-25, MRN [id number]  Patient Location: Home Provider Location: Office/Clinic  PCP:  [facility]  Cardiologist:  None Bryan Lemma, MD  Electrophysiologist:  None   Chief Complaint:   Chief Complaint  Patient presents with   Follow-up    Follow up and medications verbally reviewed with patient. Patient was not able to obtain any vital signs for today's visit.     ====================================  ASSESSMENT & PLAN:    Problem List Items Addressed This Visit       Cardiology Problems   Near syncope - Primary    No further evidence of syncope or near syncope.  I suspect again that she was likely dehydrated and had some vasovagal symptoms.  Continue to suggest adequate hydration. 80 to 100 ounces a day (roughly 3 L)-can drink rehydration drinks as well.        Other   Irregular heartbeat    No arrhythmia noted on monitor.  She did  have some PACs and PVCs but relatively rare.  She did feel them associated with sinus rhythm and slow sinus tachycardia.  With hyperdynamic left ventricle, not unexpected.  Again encouraged adequate hydration to avoid dehydration which can exacerbate palpitations and tachycardia.      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  She did have an episode where she went to the 180s and sinus tachycardia but was not symptomatic.  She was upset.  Again continue to recommend adequate hydration, avoid sodas/caffeine.      Systolic murmur (Chronic)    Normal echo.  Suspect flow murmur       ====================================  History of Present Illness:    Joy Dyer is a 21 y.o. young woman who is  ~[redacted] weeks pregnant, otherwise no significant PMH other than anxiety who presents via audio/video conferencing for a telehealth visit today as a 6-week follow-up after initial evaluation for near syncope, tachycardia and murmur.  Joy Dyer was seen for initial consultation on August 30, 2021.  This was following a near syncopal episode while sitting in class.  She felt a sense of being flushed and pressure/tightness in her chest before starting to feel lightheaded and dizzy.  She did not actually lose consciousness discovery lightheaded.  She did have several less significant episodes since then with some lightheadedness and a tight sensation in her chest.  Noted difficulty taking  Evaluated with 2D echocardiogram  and 14-day Zio patch monitor.. Recommended aggressive hydration  Hospitalizations:  None  Recent - Interim CV studies:   The following studies were reviewed today: 2D Echo 09/19/2021: Normal LV size and function.  Hyperdynamic EF 65 to 70%.  No RWMA.  Normal diastolic parameters.  Normal RV.  Normal valves.  Normal RAP.  (Normal study) 14-day Zio patch monitor: Sinus rhythm with rate range 58 to 180  bpm average 94 bpm.  Rare isolated PACs and PVCs.  No atrial runs or sustained arrhythmias.  No pauses.  Symptoms of lightheadedness and dizziness associate with sinus rhythm and sinus tachycardia rate ranged from 94 to 110 bpm.  1 episode of feeling hot and flushed with sweating and pounding in the chest associate with sinus tachycardia 103 bpm. Fastest heart rate was on 09/16/2021 at 1:30-34 AM-sinus tachycardia rate range 160 to 184 bpm.  Inerval History   Joy Dyer is doing fairly well.  She still has some of the sensations of heart pounding with tightness, but has not noticed as much of the lightheadedness and passout spells.  She has been working on hydration.  She is not having any more the syncope or near syncope sensations.  We discussed the results of her echocardiogram and monitor.  The episode on 29 January was discussed, and she notes that she was very upset having had an argument.  She had no major symptoms at that time.  Cardiovascular ROS: positive for - chest pain, irregular heartbeat, palpitations, and heart pounding however these are much improved as noted negative for - dyspnea on exertion, edema, orthopnea, paroxysmal nocturnal dyspnea, rapid heart rate, shortness of breath, or syncope/near syncope.  Less notable or prominent lightheadedness.  No TIA or amaurosis fugax.   ROS:  Please see the history of present illness.     Essentially as noted in the HPI otherwise negative.  Past Medical History:  Diagnosis Date   Anxiety    Exacerbated by pregnancy; also excessive by grandfather in ICU.   Headache    Past Surgical History:  Procedure Laterality Date   None       Current Meds  Medication Sig   butalbital-acetaminophen-caffeine (FIORICET) 50-325-40 MG tablet TAKE 1 TABLET BY MOUTH EVERY 4 HOURS AS NEEDED   ferrous sulfate (FERROUSUL) 325 (65 FE) MG tablet Take 1 tablet (325 mg total) by mouth every other day.   pantoprazole (PROTONIX) 20 MG tablet Take 1 tablet  (20 mg total) by mouth daily.   Prenatal Vit-Fe Fumarate-FA (PRENATAL MULTIVITAMIN) TABS tablet Take 1 tablet by mouth daily at 12 noon.     Allergies:   Patient has no known allergies.   Social History   Tobacco Use   Smoking status: Never   Smokeless tobacco: Never  Vaping Use   Vaping Use: Former  Substance Use Topics   Alcohol use: Not Currently   Drug use: Never     Family Hx: The patient's family history includes Aneurysm in her maternal grandmother and mother; Cancer in her maternal grandfather; Diabetes in her father; Healthy in her brother, maternal aunt, paternal grandmother, and sister; Hypertension in her father; Stroke in her maternal grandfather.   Labs/Other Tests and Data Reviewed:    EKG:   Event monitor reviewed above  Recent Labs: 09/25/2021: Hemoglobin 10.1; Platelets 348   Recent Lipid Panel No results found for: CHOL, TRIG, HDL, CHOLHDL, LDLCALC, LDLDIRECT  Wt Readings from Last 3 Encounters:  10/18/21 131 lb (59.4 kg)  10/09/21 131 lb (59.4 kg)  09/25/21 131 lb (59.4 kg)     Objective:    Vital Signs:  Ht 5' (1.524 m)    Wt 131 lb (59.4 kg)    LMP 02/25/2021    BMI 25.58 kg/m   VITAL SIGNS:  reviewed GEN:  no acute distress NEURO:  -Alert and oriented x3.  Answers questions appropriately. PSYCH:  -Normal mood and affect   ==========================================  COVID-19 Education: The signs and symptoms of COVID-19 were discussed with the patient and how to seek care for testing (follow up with PCP or arrange E-visit).   The importance of social distancing was discussed today.  Time:   Today, I have spent 11 minutes with the patient with telehealth technology discussing the above problems.   An additional 12 minutes spent charting (reviewing prior notes, hospital records, studies, labs etc.) Total 23 minutes   Medication Adjustments/Labs and Tests Ordered: Current medicines are reviewed at length with the patient today.  Concerns  regarding medicines are outlined above.   Patient Instructions  Medication Instructions:  We will try to avoid medications for now  *If you need a refill on your cardiac medications before your next appointment, please call your pharmacy*   Lab Work: None If you have labs (blood work) drawn today and your tests are completely normal, you will receive your results only by: MyChart Message (if you have MyChart) OR A paper copy in the mail If you have any lab test that is abnormal or we need to change your treatment, we will call you to review the results.   Testing/Procedures: No further testing   Follow-Up: At [facility], you and your health needs are our priority.  As part of our continuing mission to provide you with exceptional heart care, we have created designated Provider Care Teams.  These Care Teams include your primary Cardiologist (physician) and Advanced Practice Providers (APPs -  Physician Assistants and Nurse Practitioners) who all work together to provide you with the care you need, when you need it.  We recommend signing up for the patient portal called "MyChart".  Sign up information is provided on this After Visit Summary.  MyChart is used to connect with patients for Virtual Visits (Telemedicine).  Patients are able to view lab/test results, encounter notes, upcoming appointments, etc.  Non-urgent messages can be sent to your provider as well.   To learn more about what you can do with MyChart, go to [email or web address].    Your next appointment:   6 month(s)  The format for your next appointment:   Can do in person or virtual  Provider:   Bryan Lemma, MD    Other Instructions Make sure you continue to stay hydrated that means drinking anywhere from 80 to 100 ounces a day of water or rehydration drinks such as Gatorade/Powerade-however would recommend using the low sugar versions.  You can also just do drink packets for flavor.   Signed, Bryan Lemma, MD  10/18/2021 5:36 PM    [facility]

## 2021-10-18 NOTE — Assessment & Plan Note (Signed)
No arrhythmia noted on monitor.  She did have some PACs and PVCs but relatively rare.  She did feel them associated with sinus rhythm and slow sinus tachycardia. ? ?With hyperdynamic left ventricle, not unexpected.  Again encouraged adequate hydration to avoid dehydration which can exacerbate palpitations and tachycardia. ?

## 2021-10-23 ENCOUNTER — Ambulatory Visit (INDEPENDENT_AMBULATORY_CARE_PROVIDER_SITE_OTHER): Payer: Medicaid Other | Admitting: Obstetrics & Gynecology

## 2021-10-23 ENCOUNTER — Other Ambulatory Visit: Payer: Self-pay

## 2021-10-23 ENCOUNTER — Encounter: Payer: Self-pay | Admitting: Obstetrics & Gynecology

## 2021-10-23 VITALS — BP 112/66 | HR 81 | Wt 136.0 lb

## 2021-10-23 DIAGNOSIS — Z3403 Encounter for supervision of normal first pregnancy, third trimester: Secondary | ICD-10-CM

## 2021-10-23 DIAGNOSIS — Z3A34 34 weeks gestation of pregnancy: Secondary | ICD-10-CM

## 2021-10-23 NOTE — Progress Notes (Signed)
? ?  PRENATAL VISIT NOTE ? ?Subjective:  ?Joy Dyer is a 21 y.o. G1P0 at [redacted]w[redacted]d being seen today for ongoing prenatal care.  She is currently monitored for the following issues for this low-risk pregnancy and has Encounter for supervision of normal first pregnancy in third trimester; Sinus tachycardia by electrocardiogram; Near syncope; Systolic murmur; and Irregular heartbeat on their problem list. ? ?Patient reports no complaints.  Contractions: Irritability. Vag. Bleeding: None.  Movement: Present. Denies leaking of fluid.  ? ?The following portions of the patient's history were reviewed and updated as appropriate: allergies, current medications, past family history, past medical history, past social history, past surgical history and problem list.  ? ?Objective:  ? ?Vitals:  ? 10/23/21 0944  ?BP: 112/66  ?Pulse: 81  ?Weight: 136 lb (61.7 kg)  ? ? ?Fetal Status: Fetal Heart Rate (bpm): 143 Fundal Height: 34 cm Movement: Present    ? ?General:  Alert, oriented and cooperative. Patient is in no acute distress.  ?Skin: Skin is warm and dry. No rash noted.   ?Cardiovascular: Normal heart rate noted  ?Respiratory: Normal respiratory effort, no problems with respiration noted  ?Abdomen: Soft, gravid, appropriate for gestational age.  Pain/Pressure: Present     ?Pelvic: Cervical exam deferred        ?Extremities: Normal range of motion.  Edema: None  ?Mental Status: Normal mood and affect. Normal behavior. Normal judgment and thought content.  ? ?Assessment and Plan:  ?Pregnancy: G1P0 at [redacted]w[redacted]d ?1. Encounter for supervision of normal first pregnancy in third trimester ?2. [redacted] weeks gestation of pregnancy ?Pelvic cultures next visit. Preterm labor symptoms and general obstetric precautions including but not limited to vaginal bleeding, contractions, leaking of fluid and fetal movement were reviewed in detail with the patient. ?Please refer to After Visit Summary for other counseling recommendations.  ? ?Return in about 2  weeks (around 11/06/2021) for Pelvic cultures, OFFICE OB VISIT (MD only). ? ?Future Appointments  ?Date Time Provider Department Center  ?11/06/2021  9:35 AM Enijah Furr, Jethro Bastos, MD [facility]  ?11/15/2021  2:30 PM Sloane Junkin, Jethro Bastos, MD [facility]  ?11/20/2021  9:35 AM Lavonne Kinderman, Jethro Bastos, MD [facility]  ?11/27/2021  9:35 AM Rohn Fritsch, Jethro Bastos, MD [facility]  ?12/04/2021  9:35 AM Jakira Mcfadden, Jethro Bastos, MD [facility]  ?04/25/2022  4:20 PM Marykay Lex, MD [facility]  ? ? ?Jaynie Collins, MD ? ?

## 2021-10-23 NOTE — Patient Instructions (Signed)
Return to office for any scheduled appointments. Call the office or go to the MAU at [facility] if:  You begin to have strong, frequent contractions  Your water breaks.  Sometimes it is a big gush of fluid, sometimes it is just a trickle that keeps getting your panties wet or running down your legs  You have vaginal bleeding.  It is normal to have a small amount of spotting if your cervix was checked.   You do not feel your baby moving like normal.  If you do not, get something to eat and drink and lay down and focus on feeling your baby move.   If your baby is still not moving like normal, you should call the office or go to MAU.  Any other obstetric concerns.   

## 2021-10-26 ENCOUNTER — Telehealth: Payer: Self-pay

## 2021-10-26 NOTE — Telephone Encounter (Signed)
TC from pt c/o waking up this morning with a sore throat. ?Pt denies fever, cough, runny or any other sx's ?Pt may take 2 regular strength Tylenol every 4 hrs or 2 extra strength Tylenol every 6 hrs  ?Safe medication list sent to Mychart  ?If sx's worsen, report to the nearest Urgent Care over the weekend. ?Pt reports +FM  ?

## 2021-10-27 DIAGNOSIS — R051 Acute cough: Secondary | ICD-10-CM | POA: Diagnosis not present

## 2021-10-27 DIAGNOSIS — J3489 Other specified disorders of nose and nasal sinuses: Secondary | ICD-10-CM | POA: Diagnosis not present

## 2021-10-27 DIAGNOSIS — Z20822 Contact with and (suspected) exposure to covid-19: Secondary | ICD-10-CM | POA: Diagnosis not present

## 2021-10-27 DIAGNOSIS — R0981 Nasal congestion: Secondary | ICD-10-CM | POA: Diagnosis not present

## 2021-10-27 DIAGNOSIS — J069 Acute upper respiratory infection, unspecified: Secondary | ICD-10-CM | POA: Diagnosis not present

## 2021-11-06 ENCOUNTER — Ambulatory Visit (INDEPENDENT_AMBULATORY_CARE_PROVIDER_SITE_OTHER): Payer: Medicaid Other | Admitting: Obstetrics & Gynecology

## 2021-11-06 ENCOUNTER — Other Ambulatory Visit (HOSPITAL_COMMUNITY)
Admission: RE | Admit: 2021-11-06 | Discharge: 2021-11-06 | Disposition: A | Discharge status: Home / Self Care | Payer: Medicaid Other | Source: Ambulatory Visit | Attending: Obstetrics & Gynecology | Admitting: Obstetrics & Gynecology

## 2021-11-06 ENCOUNTER — Encounter: Payer: Self-pay | Admitting: Obstetrics & Gynecology

## 2021-11-06 ENCOUNTER — Other Ambulatory Visit: Payer: Self-pay

## 2021-11-06 VITALS — BP 104/69 | HR 84 | Wt 137.8 lb

## 2021-11-06 DIAGNOSIS — Z3A36 36 weeks gestation of pregnancy: Secondary | ICD-10-CM | POA: Diagnosis not present

## 2021-11-06 DIAGNOSIS — Z3403 Encounter for supervision of normal first pregnancy, third trimester: Secondary | ICD-10-CM | POA: Diagnosis not present

## 2021-11-06 DIAGNOSIS — O99891 Other specified diseases and conditions complicating pregnancy: Secondary | ICD-10-CM

## 2021-11-06 DIAGNOSIS — M549 Dorsalgia, unspecified: Secondary | ICD-10-CM

## 2021-11-06 LAB — OB RESULTS CONSOLE GC/CHLAMYDIA: Gonorrhea: NEGATIVE

## 2021-11-06 NOTE — Progress Notes (Signed)
? ?  PRENATAL VISIT NOTE ? ?Subjective:  ?Joy Dyer is a 21 y.o. G1P0 at [redacted]w[redacted]d being seen today for ongoing prenatal care.  She is currently monitored for the following issues for this low-risk pregnancy and has Encounter for supervision of normal first pregnancy in third trimester; Sinus tachycardia by electrocardiogram; Near syncope; Systolic murmur; and Irregular heartbeat on their problem list. ? ?Patient reports backache, wants to know what will help with this.  Contractions: Irritability. Vag. Bleeding: None.  Movement: Present. Denies leaking of fluid.  ? ?The following portions of the patient's history were reviewed and updated as appropriate: allergies, current medications, past family history, past medical history, past social history, past surgical history and problem list.  ? ?Objective:  ? ?Vitals:  ? 11/06/21 0945  ?BP: 104/69  ?Pulse: 84  ?Weight: 137 lb 12.8 oz (62.5 kg)  ? ? ?Fetal Status: Fetal Heart Rate (bpm): 141 Fundal Height: 36 cm Movement: Present  Presentation: Vertex ? ?General:  Alert, oriented and cooperative. Patient is in no acute distress.  ?Skin: Skin is warm and dry. No rash noted.   ?Cardiovascular: Normal heart rate noted  ?Respiratory: Normal respiratory effort, no problems with respiration noted  ?Abdomen: Soft, gravid, appropriate for gestational age.  Pain/Pressure: Present     ?Pelvic: Cervical exam performed in the presence of a chaperone Dilation: Closed Effacement (%): Thick Station: Ballotable. Pelvic cultures done.   ?Extremities: Normal range of motion.  Edema: Trace  ?Mental Status: Normal mood and affect. Normal behavior. Normal judgment and thought content.  ? ?Assessment and Plan:  ?Pregnancy: G1P0 at [redacted]w[redacted]d ?1. Back pain affecting pregnancy in third trimester ?Recommended Tylenol or Flexeril, she declines this.  Gave handout about exercises that can help. She is also thinking about chiropractor. ? ?2. [redacted] weeks gestation of pregnancy ?3. Encounter for supervision of  normal first pregnancy in third trimester ?Pelvic cultures done, will follow up results and manage accordingly. ?- GC/Chlamydia probe amp ([place])not at [facility] ?- Strep Gp B NAA ?Preterm labor symptoms and general obstetric precautions including but not limited to vaginal bleeding, contractions, leaking of fluid and fetal movement were reviewed in detail with the patient. ?Please refer to After Visit Summary for other counseling recommendations.  ? ?Return in about 1 week (around 11/13/2021) for OFFICE OB VISIT (MD or APP). ? ?Future Appointments  ?Date Time Provider [place]  ?11/15/2021 12:15 PM Klett, Rodman Pickle, NP [facility]  ?11/15/2021  2:30 PM Darah Simkin, Sallyanne Havers, MD [facility]  ?11/20/2021  9:35 AM Keyondra Lagrand, Sallyanne Havers, MD [facility]  ?11/27/2021  9:35 AM Aliene Tamura, Sallyanne Havers, MD [facility]  ?12/04/2021  9:35 AM Turrell Severt, Sallyanne Havers, MD [facility]  ?04/25/2022  4:20 PM Leonie Man, MD [facility]  ? ? ?Verita Schneiders, MD ? ?

## 2021-11-07 LAB — GC/CHLAMYDIA PROBE AMP (~~LOC~~) NOT AT ARMC
Chlamydia: NEGATIVE
Comment: NEGATIVE
Comment: NORMAL
Neisseria Gonorrhea: NEGATIVE

## 2021-11-08 LAB — STREP GP B NAA: Strep Gp B NAA: NEGATIVE

## 2021-11-14 ENCOUNTER — Inpatient Hospital Stay (HOSPITAL_COMMUNITY): Payer: Medicaid Other

## 2021-11-14 ENCOUNTER — Inpatient Hospital Stay (EMERGENCY_DEPARTMENT_HOSPITAL)
Admission: AD | Admit: 2021-11-14 | Discharge: 2021-11-14 | Disposition: A | Discharge status: Home / Self Care | Payer: Medicaid Other | Source: Home / Self Care | Attending: Obstetrics and Gynecology | Admitting: Obstetrics and Gynecology

## 2021-11-14 ENCOUNTER — Encounter (HOSPITAL_COMMUNITY): Payer: Self-pay | Admitting: Obstetrics and Gynecology

## 2021-11-14 ENCOUNTER — Other Ambulatory Visit: Payer: Self-pay

## 2021-11-14 DIAGNOSIS — O479 False labor, unspecified: Secondary | ICD-10-CM

## 2021-11-14 DIAGNOSIS — R109 Unspecified abdominal pain: Secondary | ICD-10-CM | POA: Insufficient documentation

## 2021-11-14 DIAGNOSIS — O212 Late vomiting of pregnancy: Secondary | ICD-10-CM | POA: Insufficient documentation

## 2021-11-14 DIAGNOSIS — O471 False labor at or after 37 completed weeks of gestation: Secondary | ICD-10-CM | POA: Insufficient documentation

## 2021-11-14 DIAGNOSIS — Z3A37 37 weeks gestation of pregnancy: Secondary | ICD-10-CM

## 2021-11-14 DIAGNOSIS — N1330 Unspecified hydronephrosis: Secondary | ICD-10-CM | POA: Diagnosis not present

## 2021-11-14 DIAGNOSIS — R112 Nausea with vomiting, unspecified: Secondary | ICD-10-CM

## 2021-11-14 LAB — COMPREHENSIVE METABOLIC PANEL
ALT: 11 U/L (ref 0–44)
AST: 18 U/L (ref 15–41)
Albumin: 2.9 g/dL — ABNORMAL LOW (ref 3.5–5.0)
Alkaline Phosphatase: 119 U/L (ref 38–126)
Anion gap: 10 (ref 5–15)
BUN: 5 mg/dL — ABNORMAL LOW (ref 6–20)
CO2: 21 mmol/L — ABNORMAL LOW (ref 22–32)
Calcium: 8.9 mg/dL (ref 8.9–10.3)
Chloride: 107 mmol/L (ref 98–111)
Creatinine, Ser: 0.67 mg/dL (ref 0.44–1.00)
GFR, Estimated: >60 mL/min (ref >60)
Glucose, Bld: 105 mg/dL — ABNORMAL HIGH (ref 70–99)
Potassium: 3.5 mmol/L (ref 3.5–5.1)
Sodium: 138 mmol/L (ref 135–145)
Total Bilirubin: 0.5 mg/dL (ref 0.3–1.2)
Total Protein: 6 g/dL — ABNORMAL LOW (ref 6.5–8.1)

## 2021-11-14 LAB — URINALYSIS, ROUTINE W REFLEX MICROSCOPIC
Bilirubin Urine: NEGATIVE
Glucose, UA: NEGATIVE mg/dL
Hgb urine dipstick: NEGATIVE
Ketones, ur: 5 mg/dL — AB
Nitrite: NEGATIVE
Protein, ur: NEGATIVE mg/dL
Specific Gravity, Urine: 1.01 (ref 1.005–1.030)
pH: 7 (ref 5.0–8.0)

## 2021-11-14 LAB — CBC
HCT: 30.6 % — ABNORMAL LOW (ref 36.0–46.0)
Hemoglobin: 9.9 g/dL — ABNORMAL LOW (ref 12.0–15.0)
MCH: 27.8 pg (ref 26.0–34.0)
MCHC: 32.4 g/dL (ref 30.0–36.0)
MCV: 86 fL (ref 80.0–100.0)
Platelets: 401 10*3/uL — ABNORMAL HIGH (ref 150–400)
RBC: 3.56 MIL/uL — ABNORMAL LOW (ref 3.87–5.11)
RDW: 12.9 % (ref 11.5–15.5)
WBC: 13.6 10*3/uL — ABNORMAL HIGH (ref 4.0–10.5)
nRBC: 0 % (ref 0.0–0.2)

## 2021-11-14 MED ORDER — CYCLOBENZAPRINE HCL 10 MG PO TABS
10.0000 mg | ORAL_TABLET | Freq: Two times a day (BID) | ORAL | 0 refills | Status: DC | PRN
Start: 2021-11-14 — End: 2021-11-19

## 2021-11-14 MED ORDER — SODIUM CHLORIDE 0.9 % IV SOLN
8.0000 mg | Freq: Once | INTRAVENOUS | Status: AC
  Administered 2021-11-14: 8 mg via INTRAVENOUS
  Filled 2021-11-14: qty 4

## 2021-11-14 MED ORDER — SODIUM CHLORIDE 0.9 % IV SOLN
25.0000 mg | Freq: Once | INTRAVENOUS | Status: AC
  Administered 2021-11-14: 25 mg via INTRAVENOUS
  Filled 2021-11-14: qty 1

## 2021-11-14 MED ORDER — LACTATED RINGERS IV BOLUS
1000.0000 mL | Freq: Once | INTRAVENOUS | Status: AC
Start: 2021-11-14 — End: 2021-11-14
  Administered 2021-11-14: 1000 mL via INTRAVENOUS

## 2021-11-14 MED ORDER — HYDROMORPHONE HCL 1 MG/ML IJ SOLN
1.0000 mg | Freq: Once | INTRAMUSCULAR | Status: AC
  Administered 2021-11-14: 1 mg via INTRAVENOUS
  Filled 2021-11-14: qty 1

## 2021-11-14 MED ORDER — FAMOTIDINE IN NACL 20-0.9 MG/50ML-% IV SOLN
20.0000 mg | Freq: Once | INTRAVENOUS | Status: AC
  Administered 2021-11-14: 20 mg via INTRAVENOUS
  Filled 2021-11-14: qty 50

## 2021-11-14 NOTE — Discharge Instructions (Signed)
2/3-1-1 Rule Go to MAU for painful contractions every 2-3 minutes, lasting 1 minute each for 1.5 hours.  

## 2021-11-14 NOTE — MAU Provider Note (Signed)
?History  ?  ? ?CSN: [id number] ? ?Arrival date and time: 11/14/21 0200 ? ? Event Date/Time  ? First Provider Initiated Contact with Patient 11/14/21 [phone]   ?  ? ?Chief Complaint  ?Patient presents with  ? Abdominal Pain  ? ?Ms. Joy Dyer is a 21 y.o. year old G1P0 female at [redacted]w[redacted]d weeks gestation who presents to MAU reporting onset of sharp RT flank pain that wraps around to her back for 2 days. She noticed she started having contractions tonight which made the pain worse. She also reports being constipated, but able to have a BM " not as much." She reports vomiting started tonight also. She denies VB or LOF. She reports good (+) FM. Her FOB is present and contributing to the history taking.  ? ? ?OB History   ? ? Gravida  ?1  ? Para  ?   ? Term  ?   ? Preterm  ?   ? AB  ?   ? Living  ?   ?  ? ? SAB  ?   ? IAB  ?   ? Ectopic  ?   ? Multiple  ?   ? Live Births  ?   ?   ?  ?  ? ? ?Past Medical History:  ?Diagnosis Date  ? Anxiety   ? Exacerbated by pregnancy; also excessive by grandfather in ICU.  ? Headache   ? ? ?Past Surgical History:  ?Procedure Laterality Date  ? None    ? ? ?Family History  ?Problem Relation Age of Onset  ? Aneurysm Mother   ? Hypertension Father   ? Diabetes Father   ? Healthy Sister   ? Healthy Brother   ? Aneurysm Maternal Grandmother   ? Stroke Maternal Grandfather   ? Cancer Maternal Grandfather   ? Healthy Paternal Grandmother   ? Healthy Maternal Aunt   ? ? ?Social History  ? ?Tobacco Use  ? Smoking status: Never  ? Smokeless tobacco: Never  ?Vaping Use  ? Vaping Use: Former  ?Substance Use Topics  ? Alcohol use: Not Currently  ? Drug use: Never  ? ? ?Allergies: No Known Allergies ? ?Medications Prior to Admission  ?Medication Sig Dispense Refill Last Dose  ? ferrous sulfate (FERROUSUL) 325 (65 FE) MG tablet Take 1 tablet (325 mg total) by mouth every other day. 35 tablet 0 Past Week  ? pantoprazole (PROTONIX) 20 MG tablet Take 1 tablet (20 mg total) by mouth daily. 30 tablet 3 Past Week   ? Prenatal Vit-Fe Fumarate-FA (PRENATAL MULTIVITAMIN) TABS tablet Take 1 tablet by mouth daily at 12 noon.   11/13/2021  ? ? ?Review of Systems  ?Constitutional: Negative.   ?HENT: Negative.    ?Eyes: Negative.   ?Respiratory: Negative.    ?Cardiovascular: Negative.   ?Gastrointestinal:  Positive for constipation, nausea and vomiting.  ?Endocrine: Negative.   ?Genitourinary:  Positive for pelvic pain (contractions). Negative for dysuria, hematuria and urgency.  ?Musculoskeletal: Negative.   ?Skin: Negative.   ?Allergic/Immunologic: Negative.   ?Neurological: Negative.   ?Hematological: Negative.   ?Psychiatric/Behavioral: Negative.    ?Physical Exam  ? ?Blood pressure (!) 124/57, pulse 91, temperature 98.6 ?F (37 ?C), temperature source Oral, resp. rate 18, height 5' (1.524 m), weight 62.8 kg, last menstrual period 02/25/2021, SpO2 99 %. ? ?Physical Exam ?Vitals and nursing note reviewed. Exam conducted with a chaperone present.  ?Constitutional:   ?   Appearance: Normal appearance. She is normal weight.  ?  Cardiovascular:  ?   Rate and Rhythm: Normal rate.  ?   Pulses: Normal pulses.  ?Pulmonary:  ?   Effort: Pulmonary effort is normal.  ?Abdominal:  ?   Palpations: Abdomen is soft.  ?   Tenderness: There is right CVA tenderness.  ?Genitourinary: ?   General: Normal vulva.  ?   Comments: Dilation: Fingertip ?Effacement (%): 50 ?Cervical Position: Posterior ?Station: -3 ?Presentation: Vertex ?Exam by: Raelyn Mora, CNM  ?Musculoskeletal:     ?   General: Normal range of motion.  ?Skin: ?   General: Skin is warm and dry.  ?Neurological:  ?   Mental Status: She is alert and oriented to person, place, and time.  ?Psychiatric:     ?   Mood and Affect: Mood normal.     ?   Behavior: Behavior normal.     ?   Thought Content: Thought content normal.     ?   Judgment: Judgment normal.  ? ? ?MAU Course  ?Procedures ? ?MDM ?CCUA ?CBC ?LR bolus 1000 ml @ 999 ml/hr ?Pepcid 20 mg IVPB ?Zofran 8 mg IVPB ?Renal U/S ?Phenergan 25  mg IVP ?Dilaudid 1 mg IVP -- resolved pain ? ?Results for orders placed or performed during the hospital encounter of 11/14/21 (from the past 24 hour(s))  ?Urinalysis, Routine w reflex microscopic Urine, Clean Catch     Status: Abnormal  ? Collection Time: 11/14/21  2:45 AM  ?Result Value Ref Range  ? Color, Urine YELLOW YELLOW  ? APPearance CLEAR CLEAR  ? Specific Gravity, Urine 1.010 1.005 - 1.030  ? pH 7.0 5.0 - 8.0  ? Glucose, UA NEGATIVE NEGATIVE mg/dL  ? Hgb urine dipstick NEGATIVE NEGATIVE  ? Bilirubin Urine NEGATIVE NEGATIVE  ? Ketones, ur 5 (A) NEGATIVE mg/dL  ? Protein, ur NEGATIVE NEGATIVE mg/dL  ? Nitrite NEGATIVE NEGATIVE  ? Leukocytes,Ua MODERATE (A) NEGATIVE  ? RBC / HPF 0-5 0 - 5 RBC/hpf  ? WBC, UA 0-5 0 - 5 WBC/hpf  ? Bacteria, UA RARE (A) NONE SEEN  ? Squamous Epithelial / LPF 0-5 0 - 5  ? Mucus PRESENT   ?CBC     Status: Abnormal  ? Collection Time: 11/14/21  3:34 AM  ?Result Value Ref Range  ? WBC 13.6 (H) 4.0 - 10.5 K/uL  ? RBC 3.56 (L) 3.87 - 5.11 MIL/uL  ? Hemoglobin 9.9 (L) 12.0 - 15.0 g/dL  ? HCT 30.6 (L) 36.0 - 46.0 %  ? MCV 86.0 80.0 - 100.0 fL  ? MCH 27.8 26.0 - 34.0 pg  ? MCHC 32.4 30.0 - 36.0 g/dL  ? RDW 12.9 11.5 - 15.5 %  ? Platelets 401 (H) 150 - 400 K/uL  ? nRBC 0.0 0.0 - 0.2 %  ?Comprehensive metabolic panel     Status: Abnormal  ? Collection Time: 11/14/21  3:34 AM  ?Result Value Ref Range  ? Sodium 138 135 - 145 mmol/L  ? Potassium 3.5 3.5 - 5.1 mmol/L  ? Chloride 107 98 - 111 mmol/L  ? CO2 21 (L) 22 - 32 mmol/L  ? Glucose, Bld 105 (H) 70 - 99 mg/dL  ? BUN 5 (L) 6 - 20 mg/dL  ? Creatinine, Ser 0.67 0.44 - 1.00 mg/dL  ? Calcium 8.9 8.9 - 10.3 mg/dL  ? Total Protein 6.0 (L) 6.5 - 8.1 g/dL  ? Albumin 2.9 (L) 3.5 - 5.0 g/dL  ? AST 18 15 - 41 U/L  ? ALT 11 0 - 44  U/L  ? Alkaline Phosphatase 119 38 - 126 U/L  ? Total Bilirubin 0.5 0.3 - 1.2 mg/dL  ? GFR, Estimated >60 >60 mL/min  ? Anion gap 10 5 - 15  ?  ?US RENAL ? ?Result Date: 11/14/2021 ?CLINICAL DATA:  Right-sided pain.   Thirty-seven weeks pregnant. EXAM: RENAL / URINARY TRACT ULTRASOUND COMPLETE COMPARISON:  None. FINDINGS: Right Kidney: Renal measurements: 12.7 x 6.4 x 4.9 cm = volume: 209 mL. Normal echogenicity. Mild hydronephrosis. No shadowing stone. Left Kidney: Renal measurements: 10.8 x 5.6 x 5.2 cm = volume: 165 mL. Normal echogenicity. Mild hydronephrosis. No shadowing stone. Bladder: The urinary bladder is collapsed. Other: None. IMPRESSION: Mild bilateral hydronephrosis, right greater than left. No shadowing stone. Electronically Signed   By: Elgie CollardArash  Radparvar M.D.   On: 11/14/2021 04:04    ? ?Assessment and Plan  ?Acute right flank pain  ?- Rx for Flexeril 10 mg BID prn pain ?- May take Tylenol 1000 mg po every 8 hours prn pain ?- Information provided on flank pain ?  ?Nausea and vomiting, unspecified vomiting type ?- Resolved ? ?False labor ?- 2/3-1-1 Rule: Go to MAU for painful contractions every 2-3 minutes, lasting 1 minute each for 1.5 hours.  ? ?[redacted] weeks gestation of pregnancy  ? ?- Discharge patient ?- Keep scheduled appt with [place] on 11/15/2021 ?- Patient verbalized an understanding of the plan of care and agrees.  ? ? ?Raelyn Moraolitta Barbar Brede, CNM ?11/14/2021, 3:13 AM  ?

## 2021-11-14 NOTE — MAU Note (Addendum)
Joy Dyer is a 21 y.o. at [redacted]w[redacted]d here in MAU reporting: sharp right sided abdominal pain that wraps around to her back for the past 2 days and has been constant. Pt states she felt like she started having ctx tonight which has made the pain worse. Also reporting episodes of emesis tonight as well. States she has been constipated, was able to have BM today but "not as much". Denies VB or LOF. +FM  ? ?Onset of complaint: 2100 ?Pain score: 8 ?Vitals:  ? 11/14/21 0212  ?BP: 124/73  ?Pulse: 99  ?Resp: 18  ?Temp: 98.6 ?F (37 ?C)  ?SpO2: 99%  ?   ?FHT: 155 ?Lab orders placed from triage: UA ? ?

## 2021-11-15 ENCOUNTER — Inpatient Hospital Stay (HOSPITAL_COMMUNITY): Payer: Medicaid Other

## 2021-11-15 ENCOUNTER — Encounter: Payer: Medicaid Other | Admitting: Obstetrics & Gynecology

## 2021-11-15 ENCOUNTER — Encounter (HOSPITAL_COMMUNITY): Payer: Self-pay | Admitting: Obstetrics and Gynecology

## 2021-11-15 ENCOUNTER — Ambulatory Visit (INDEPENDENT_AMBULATORY_CARE_PROVIDER_SITE_OTHER): Payer: Self-pay | Admitting: Pediatrics

## 2021-11-15 ENCOUNTER — Inpatient Hospital Stay (HOSPITAL_COMMUNITY)
Admission: AD | Admit: 2021-11-15 | Discharge: 2021-11-19 | DRG: 807 | Disposition: A | Discharge status: Home / Self Care | Payer: Medicaid Other | Attending: Obstetrics & Gynecology | Admitting: Obstetrics & Gynecology

## 2021-11-15 DIAGNOSIS — N1330 Unspecified hydronephrosis: Secondary | ICD-10-CM | POA: Diagnosis not present

## 2021-11-15 DIAGNOSIS — O4593 Premature separation of placenta, unspecified, third trimester: Principal | ICD-10-CM | POA: Diagnosis present

## 2021-11-15 DIAGNOSIS — O9902 Anemia complicating childbirth: Secondary | ICD-10-CM | POA: Diagnosis not present

## 2021-11-15 DIAGNOSIS — R109 Unspecified abdominal pain: Secondary | ICD-10-CM | POA: Diagnosis not present

## 2021-11-15 DIAGNOSIS — O99344 Other mental disorders complicating childbirth: Secondary | ICD-10-CM | POA: Diagnosis not present

## 2021-11-15 DIAGNOSIS — Z3490 Encounter for supervision of normal pregnancy, unspecified, unspecified trimester: Secondary | ICD-10-CM

## 2021-11-15 DIAGNOSIS — R1030 Lower abdominal pain, unspecified: Secondary | ICD-10-CM | POA: Diagnosis present

## 2021-11-15 DIAGNOSIS — Z3A00 Weeks of gestation of pregnancy not specified: Secondary | ICD-10-CM | POA: Diagnosis not present

## 2021-11-15 DIAGNOSIS — R1010 Upper abdominal pain, unspecified: Secondary | ICD-10-CM | POA: Diagnosis not present

## 2021-11-15 DIAGNOSIS — Z3A37 37 weeks gestation of pregnancy: Secondary | ICD-10-CM | POA: Diagnosis not present

## 2021-11-15 DIAGNOSIS — Z7681 Expectant parent(s) prebirth pediatrician visit: Secondary | ICD-10-CM

## 2021-11-15 DIAGNOSIS — D649 Anemia, unspecified: Secondary | ICD-10-CM | POA: Diagnosis not present

## 2021-11-15 DIAGNOSIS — O26899 Other specified pregnancy related conditions, unspecified trimester: Secondary | ICD-10-CM

## 2021-11-15 DIAGNOSIS — F419 Anxiety disorder, unspecified: Secondary | ICD-10-CM | POA: Diagnosis not present

## 2021-11-15 LAB — WET PREP, GENITAL
Sperm: NONE SEEN
Trich, Wet Prep: NONE SEEN
WBC, Wet Prep HPF POC: >=10 — AB (ref <10)
Yeast Wet Prep HPF POC: NONE SEEN

## 2021-11-15 LAB — URINALYSIS, ROUTINE W REFLEX MICROSCOPIC
Bacteria, UA: NONE SEEN
Bilirubin Urine: NEGATIVE
Glucose, UA: NEGATIVE mg/dL
Hgb urine dipstick: NEGATIVE
Ketones, ur: NEGATIVE mg/dL
Nitrite: NEGATIVE
Protein, ur: NEGATIVE mg/dL
Specific Gravity, Urine: 1.009 (ref 1.005–1.030)
pH: 7 (ref 5.0–8.0)

## 2021-11-15 LAB — CBC WITH DIFFERENTIAL/PLATELET
Abs Immature Granulocytes: 0.05 10*3/uL (ref 0.00–0.07)
Basophils Absolute: 0 10*3/uL (ref 0.0–0.1)
Basophils Relative: 0 %
Eosinophils Absolute: 0.1 10*3/uL (ref 0.0–0.5)
Eosinophils Relative: 0 %
HCT: 32.2 % — ABNORMAL LOW (ref 36.0–46.0)
Hemoglobin: 10.5 g/dL — ABNORMAL LOW (ref 12.0–15.0)
Immature Granulocytes: 0 %
Lymphocytes Relative: 15 %
Lymphs Abs: 1.7 10*3/uL (ref 0.7–4.0)
MCH: 28.3 pg (ref 26.0–34.0)
MCHC: 32.6 g/dL (ref 30.0–36.0)
MCV: 86.8 fL (ref 80.0–100.0)
Monocytes Absolute: 0.7 10*3/uL (ref 0.1–1.0)
Monocytes Relative: 6 %
Neutro Abs: 9 10*3/uL — ABNORMAL HIGH (ref 1.7–7.7)
Neutrophils Relative %: 79 %
Platelets: 390 10*3/uL (ref 150–400)
RBC: 3.71 MIL/uL — ABNORMAL LOW (ref 3.87–5.11)
RDW: 13.2 % (ref 11.5–15.5)
WBC: 11.5 10*3/uL — ABNORMAL HIGH (ref 4.0–10.5)
nRBC: 0 % (ref 0.0–0.2)

## 2021-11-15 LAB — COMPREHENSIVE METABOLIC PANEL
ALT: 12 U/L (ref 0–44)
AST: 17 U/L (ref 15–41)
Albumin: 3.1 g/dL — ABNORMAL LOW (ref 3.5–5.0)
Alkaline Phosphatase: 127 U/L — ABNORMAL HIGH (ref 38–126)
Anion gap: 8 (ref 5–15)
BUN: 5 mg/dL — ABNORMAL LOW (ref 6–20)
CO2: 23 mmol/L (ref 22–32)
Calcium: 8.8 mg/dL — ABNORMAL LOW (ref 8.9–10.3)
Chloride: 108 mmol/L (ref 98–111)
Creatinine, Ser: 0.72 mg/dL (ref 0.44–1.00)
GFR, Estimated: >60 mL/min (ref >60)
Glucose, Bld: 85 mg/dL (ref 70–99)
Potassium: 3.4 mmol/L — ABNORMAL LOW (ref 3.5–5.1)
Sodium: 139 mmol/L (ref 135–145)
Total Bilirubin: 0.4 mg/dL (ref 0.3–1.2)
Total Protein: 6.4 g/dL — ABNORMAL LOW (ref 6.5–8.1)

## 2021-11-15 LAB — TYPE AND SCREEN
ABO/RH(D): B POS
Antibody Screen: NEGATIVE

## 2021-11-15 MED ORDER — NIFEDIPINE 10 MG PO CAPS
10.0000 mg | ORAL_CAPSULE | Freq: Once | ORAL | Status: DC
  Filled 2021-11-15: qty 1

## 2021-11-15 MED ORDER — OXYTOCIN-SODIUM CHLORIDE 30-0.9 UT/500ML-% IV SOLN
2.5000 [IU]/h | INTRAVENOUS | Status: DC
  Filled 2021-11-15 (×2): qty 500

## 2021-11-15 MED ORDER — SOD CITRATE-CITRIC ACID 500-334 MG/5ML PO SOLN: 30.0000 mL | ORAL | Status: DC | PRN

## 2021-11-15 MED ORDER — FENTANYL CITRATE (PF) 100 MCG/2ML IJ SOLN
50.0000 ug | INTRAMUSCULAR | Status: DC | PRN
  Administered 2021-11-15 – 2021-11-17 (×6): 100 ug via INTRAVENOUS
  Filled 2021-11-15 (×6): qty 2

## 2021-11-15 MED ORDER — OXYCODONE-ACETAMINOPHEN 5-325 MG PO TABS: 2.0000 | ORAL_TABLET | ORAL | Status: DC | PRN

## 2021-11-15 MED ORDER — LACTATED RINGERS IV SOLN
500.0000 mL | INTRAVENOUS | Status: DC | PRN
  Administered 2021-11-16: 500 mL via INTRAVENOUS

## 2021-11-15 MED ORDER — LACTATED RINGERS IV SOLN
INTRAVENOUS | Status: DC
  Administered 2021-11-16: 125 mL/h via INTRAVENOUS

## 2021-11-15 MED ORDER — ONDANSETRON HCL 4 MG/2ML IJ SOLN
4.0000 mg | Freq: Four times a day (QID) | INTRAMUSCULAR | Status: DC | PRN
  Administered 2021-11-16 – 2021-11-17 (×2): 4 mg via INTRAVENOUS
  Filled 2021-11-15 (×2): qty 2

## 2021-11-15 MED ORDER — PROCHLORPERAZINE EDISYLATE 10 MG/2ML IJ SOLN
10.0000 mg | Freq: Once | INTRAMUSCULAR | Status: AC
Start: 2021-11-15 — End: 2021-11-15
  Administered 2021-11-15: 10 mg via INTRAVENOUS
  Filled 2021-11-15: qty 2

## 2021-11-15 MED ORDER — TERBUTALINE SULFATE 1 MG/ML IJ SOLN: 0.2500 mg | Freq: Once | INTRAMUSCULAR | Status: DC | PRN

## 2021-11-15 MED ORDER — MISOPROSTOL 25 MCG QUARTER TABLET
25.0000 ug | ORAL_TABLET | ORAL | Status: DC | PRN
  Administered 2021-11-15 – 2021-11-16 (×2): 25 ug via VAGINAL
  Filled 2021-11-15 (×2): qty 1

## 2021-11-15 MED ORDER — FENTANYL CITRATE (PF) 100 MCG/2ML IJ SOLN
100.0000 ug | Freq: Once | INTRAMUSCULAR | Status: AC
  Administered 2021-11-15: 100 ug via INTRAVENOUS
  Filled 2021-11-15: qty 2

## 2021-11-15 MED ORDER — LIDOCAINE HCL (PF) 1 % IJ SOLN
30.0000 mL | INTRAMUSCULAR | Status: AC | PRN
  Administered 2021-11-17: 30 mL via SUBCUTANEOUS
  Filled 2021-11-15: qty 30

## 2021-11-15 MED ORDER — ACETAMINOPHEN 325 MG PO TABS: 650.0000 mg | ORAL_TABLET | ORAL | Status: DC | PRN

## 2021-11-15 MED ORDER — OXYTOCIN BOLUS FROM INFUSION
333.0000 mL | Freq: Once | INTRAVENOUS | Status: AC
  Administered 2021-11-17: 333 mL via INTRAVENOUS

## 2021-11-15 MED ORDER — FENTANYL CITRATE (PF) 100 MCG/2ML IJ SOLN
100.0000 ug | Freq: Once | INTRAMUSCULAR | Status: AC
  Administered 2021-11-15: 100 ug via INTRAVENOUS
  Filled 2021-11-15 (×2): qty 2

## 2021-11-15 MED ORDER — LACTATED RINGERS IV BOLUS
1000.0000 mL | Freq: Once | INTRAVENOUS | Status: AC
  Administered 2021-11-15: 1000 mL via INTRAVENOUS

## 2021-11-15 MED ORDER — LACTATED RINGERS IV BOLUS: 1000.0000 mL | Freq: Once | INTRAVENOUS | Status: DC

## 2021-11-15 MED ORDER — OXYCODONE-ACETAMINOPHEN 5-325 MG PO TABS: 1.0000 | ORAL_TABLET | ORAL | Status: DC | PRN

## 2021-11-15 NOTE — Progress Notes (Signed)
Prenatal counseling for impending newborn done. Mom agrees with vaccine policy. ?Z76.81 ? ?

## 2021-11-15 NOTE — Plan of Care (Signed)
POC discussed with patient. Patient verbalized understanding.  

## 2021-11-15 NOTE — MAU Note (Signed)
Vertex presentation confirmed by bedside U/S by Venia Carbon, NP. ?

## 2021-11-15 NOTE — H&P (Signed)
OBSTETRIC ADMISSION HISTORY AND PHYSICAL ? ?Joy Dyer is a 21 y.o. female G1P0 with IUP at [redacted]w[redacted]d by LMP presenting for IOL due to severe back/right lower abdominal pain with inability to rule out placental abruption. Appendicitis ruled out and imaging less concerning for renal stone.  She reports +FMs, No LOF, no VB, no blurry vision, headaches, or peripheral edema.  She plans on breast feeding. She request depo for birth control. ?She received her prenatal care at  [facility]   ? ?Dating: By LMP --->  Estimated Date of Delivery: 12/02/21 ? ?Prenatal History/Complications:  ?--Systolic murmur  ?--history of syncope with blood draws  ? ?Past Medical History: ?Past Medical History:  ?Diagnosis Date  ? Anxiety   ? Exacerbated by pregnancy; also excessive by grandfather in ICU.  ? Headache   ? ? ?Past Surgical History: ?Past Surgical History:  ?Procedure Laterality Date  ? None    ? ? ?Obstetrical History: ?OB History   ? ? Gravida  ?1  ? Para  ?   ? Term  ?   ? Preterm  ?   ? AB  ?   ? Living  ?   ?  ? ? SAB  ?   ? IAB  ?   ? Ectopic  ?   ? Multiple  ?   ? Live Births  ?   ?   ?  ?  ? ? ?Social History ?Social History  ? ?Socioeconomic History  ? Marital status: Single  ?  Spouse name: Not on file  ? Number of children: Not on file  ? Years of education: Not on file  ? Highest education level: High school graduate  ?Occupational History  ? Occupation: International aid/development worker  ?Tobacco Use  ? Smoking status: Never  ? Smokeless tobacco: Never  ?Vaping Use  ? Vaping Use: Former  ?Substance and Sexual Activity  ? Alcohol use: Not Currently  ? Drug use: Never  ? Sexual activity: Not Currently  ?  Birth control/protection: None  ?Other Topics Concern  ? Not on file  ?Social History Narrative  ? 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  Off-and-on living with her boyfriend (who is the father of her unborn child).  She has significant anxiety about their relationship and about his ability to have maturity to be an  active father.  She acknowledges that he has 1 year old boy and may not have the maturity to stick around.  ?   ? She also has significant stress going on right now.  Her grandfather is in the ICU at [facility].  It sounds like he is probably being placed on comfort care measures.  She is quite sad and under lots of stress.  ? ?Social Determinants of Health  ? ?Financial Resource Strain: Not on file  ?Food Insecurity: Not on file  ?Transportation Needs: Not on file  ?Physical Activity: Not on file  ?Stress: Not on file  ?Social Connections: Not on file  ? ? ?Family History: ?Family History  ?Problem Relation Age of Onset  ? Aneurysm Mother   ? Hypertension Father   ? Diabetes Father   ? Healthy Sister   ? Healthy Brother   ? Aneurysm Maternal Grandmother   ? Stroke Maternal Grandfather   ? Cancer Maternal Grandfather   ? Healthy Paternal Grandmother   ? Healthy Maternal Aunt   ? ? ?Allergies: ?No Known Allergies ? ?Medications Prior to Admission  ?Medication Sig Dispense Refill Last Dose  ? docusate  sodium (COLACE) 250 MG capsule Take 250 mg by mouth daily.   11/15/2021  ? ferrous sulfate (FERROUSUL) 325 (65 FE) MG tablet Take 1 tablet (325 mg total) by mouth every other day. 35 tablet 0 Past Week  ? pantoprazole (PROTONIX) 20 MG tablet Take 1 tablet (20 mg total) by mouth daily. 30 tablet 3 Past Week  ? Prenatal Vit-Fe Fumarate-FA (PRENATAL MULTIVITAMIN) TABS tablet Take 1 tablet by mouth daily at 12 noon.   11/14/2021  ? cyclobenzaprine (FLEXERIL) 10 MG tablet Take 1 tablet (10 mg total) by mouth 2 (two) times daily as needed for muscle spasms. 20 tablet 0 Unknown  ? ? ? ?Review of Systems  ? ?All systems reviewed and negative except as stated in HPI ? ?Blood pressure 116/62, pulse 88, temperature 97.7 ?F (36.5 ?C), temperature source Oral, resp. rate 14, height 5' (1.524 m), weight 62.6 kg, last menstrual period 02/25/2021, SpO2 100 %. ?General appearance: alert, cooperative, and no distress (received IV fent  downstairs)  ?Lungs: Normal WOB  ?Heart: regular rate  ?Abdomen: soft, non-tender currently.  ?Pelvic: NEFG ?Extremities: Homans sign is negative, no sign of DVT ?Presentation: cephalic ?Fetal monitoringBaseline: 135 bpm, Variability: Good {> 6 bpm), Accelerations: Reactive, and Decelerations: Absent ?Uterine activity every few minutes  ?Dilation: 1 ?Effacement (%): Thick ?Station: -1, -2 ?Exam by:: Venia CarbonJennifer Rasch, NP ? ? ?Prenatal labs: ?ABO, Rh: --/--/B POS (03/30 1602) ?Antibody: NEG (03/30 1602) ?Rubella: 5.67 (10/04 1544) ?RPR: Non Reactive (02/07 1523)  ?HBsAg: Negative (10/04 1544)  ?HIV: Non Reactive (02/07 1523)  ?GBS: Negative/-- (03/21 1039)  ?2 hr Glucola WNL ?Genetic screening LR NIPS ?Anatomy US normal  ? ?Prenatal Transfer Tool  ?Maternal Diabetes: No ?Genetic Screening: Normal ?Maternal Ultrasounds/Referrals: Normal ?Fetal Ultrasounds or other Referrals:  None ?Maternal Substance Abuse:  No ?Significant Maternal Medications:  None ?Significant Maternal Lab Results: Group B Strep negative ? ?Results for orders placed or performed during the hospital encounter of 11/15/21 (from the past 24 hour(s))  ?Urinalysis, Routine w reflex microscopic Urine, Clean Catch  ? Collection Time: 11/15/21  1:53 PM  ?Result Value Ref Range  ? Color, Urine YELLOW YELLOW  ? APPearance CLEAR CLEAR  ? Specific Gravity, Urine 1.009 1.005 - 1.030  ? pH 7.0 5.0 - 8.0  ? Glucose, UA NEGATIVE NEGATIVE mg/dL  ? Hgb urine dipstick NEGATIVE NEGATIVE  ? Bilirubin Urine NEGATIVE NEGATIVE  ? Ketones, ur NEGATIVE NEGATIVE mg/dL  ? Protein, ur NEGATIVE NEGATIVE mg/dL  ? Nitrite NEGATIVE NEGATIVE  ? Leukocytes,Ua MODERATE (A) NEGATIVE  ? RBC / HPF 0-5 0 - 5 RBC/hpf  ? WBC, UA 6-10 0 - 5 WBC/hpf  ? Bacteria, UA NONE SEEN NONE SEEN  ? Squamous Epithelial / LPF 0-5 0 - 5  ? Mucus PRESENT   ?Wet prep, genital  ? Collection Time: 11/15/21  3:15 PM  ? Specimen: Vaginal  ?Result Value Ref Range  ? Yeast Wet Prep HPF POC NONE SEEN NONE SEEN  ?  Trich, Wet Prep NONE SEEN NONE SEEN  ? Clue Cells Wet Prep HPF POC PRESENT (A) NONE SEEN  ? WBC, Wet Prep HPF POC >=10 (A) <10  ? Sperm NONE SEEN   ?CBC with Differential/Platelet  ? Collection Time: 11/15/21  3:34 PM  ?Result Value Ref Range  ? WBC 11.5 (H) 4.0 - 10.5 K/uL  ? RBC 3.71 (L) 3.87 - 5.11 MIL/uL  ? Hemoglobin 10.5 (L) 12.0 - 15.0 g/dL  ? HCT 32.2 (L) 36.0 - 46.0 %  ?  MCV 86.8 80.0 - 100.0 fL  ? MCH 28.3 26.0 - 34.0 pg  ? MCHC 32.6 30.0 - 36.0 g/dL  ? RDW 13.2 11.5 - 15.5 %  ? Platelets 390 150 - 400 K/uL  ? nRBC 0.0 0.0 - 0.2 %  ? Neutrophils Relative % 79 %  ? Neutro Abs 9.0 (H) 1.7 - 7.7 K/uL  ? Lymphocytes Relative 15 %  ? Lymphs Abs 1.7 0.7 - 4.0 K/uL  ? Monocytes Relative 6 %  ? Monocytes Absolute 0.7 0.1 - 1.0 K/uL  ? Eosinophils Relative 0 %  ? Eosinophils Absolute 0.1 0.0 - 0.5 K/uL  ? Basophils Relative 0 %  ? Basophils Absolute 0.0 0.0 - 0.1 K/uL  ? Immature Granulocytes 0 %  ? Abs Immature Granulocytes 0.05 0.00 - 0.07 K/uL  ?Comprehensive metabolic panel  ? Collection Time: 11/15/21  3:34 PM  ?Result Value Ref Range  ? Sodium 139 135 - 145 mmol/L  ? Potassium 3.4 (L) 3.5 - 5.1 mmol/L  ? Chloride 108 98 - 111 mmol/L  ? CO2 23 22 - 32 mmol/L  ? Glucose, Bld 85 70 - 99 mg/dL  ? BUN 5 (L) 6 - 20 mg/dL  ? Creatinine, Ser 0.72 0.44 - 1.00 mg/dL  ? Calcium 8.8 (L) 8.9 - 10.3 mg/dL  ? Total Protein 6.4 (L) 6.5 - 8.1 g/dL  ? Albumin 3.1 (L) 3.5 - 5.0 g/dL  ? AST 17 15 - 41 U/L  ? ALT 12 0 - 44 U/L  ? Alkaline Phosphatase 127 (H) 38 - 126 U/L  ? Total Bilirubin 0.4 0.3 - 1.2 mg/dL  ? GFR, Estimated >60 >60 mL/min  ? Anion gap 8 5 - 15  ?Type and screen [facility]  ? Collection Time: 11/15/21  4:02 PM  ?Result Value Ref Range  ? ABO/RH(D) B POS   ? Antibody Screen NEG   ? Sample Expiration    ?  11/18/2021,2359 ?Performed at [facility], [address], [place], [place] [address] ?  ? ? ?Patient Active Problem List  ? Diagnosis Date Noted  ? Intrauterine normal pregnancy  11/15/2021  ? Irregular heartbeat 10/18/2021  ? Sinus tachycardia by electrocardiogram 08/31/2021  ? Near syncope 08/31/2021  ? Systolic murmur 08/31/2021  ? Encounter for supervision of normal first pregnancy in third tri

## 2021-11-15 NOTE — MAU Note (Signed)
Transport arrived to take patient to MRI. NP made aware. ?

## 2021-11-15 NOTE — MAU Note (Signed)
.  Joy Dyer is a 21 y.o. at [redacted]w[redacted]d here in MAU reporting: was in MAU yesterday for the same thing. C/o constant right back pain that wraps around to her front. Thought it may be kidney stone but U/s was negative. Was given RX for muscle relaxer but unable to pick it up was sent to school pharmacy which is an hour away.  ?LMP:  ?Onset of complaint: 3-4 days ?Pain score: 8 ?Vitals:  ? 11/15/21 1344  ?BP: 131/75  ?Pulse: 82  ?Resp: 18  ?Temp: 97.9 ?F (36.6 ?C)  ?   ?FHT:132 ?Lab orders placed from triage:   ? ?

## 2021-11-15 NOTE — MAU Note (Signed)
MRI stated patient may have to wait four hours for NPO status since patient had ice chips. NP made aware. MRI to call unit back. ?

## 2021-11-15 NOTE — MAU Provider Note (Addendum)
?History  ?  ? ?CSN: [id number] ? ?Arrival date and time: 11/15/21 1326 ? ? Event Date/Time  ? First Provider Initiated Contact with Patient 11/15/21 1458   ?  ? ?Chief Complaint  ?Patient presents with  ? Back Pain  ? Contractions  ? Emesis  ? ?HPI ?Joy Dyer is a 21 y/o G1P0 at GA: 37w 4d presents today with right back pain in the last 4 days that irradiates to the RLQ, associated with nauseas, decreased appetite. She consulted AMU yesterday for same reason, she had Renal US that showed mild bilateral hydronephrosis.No renal stones. She was discharged home with pain medicine.  ?She reports contractions and white vaginal discharge, not  vaginal itching.  ? ?OB History   ? ? Gravida  ?1  ? Para  ?   ? Term  ?   ? Preterm  ?   ? AB  ?   ? Living  ?   ?  ? ? SAB  ?   ? IAB  ?   ? Ectopic  ?   ? Multiple  ?   ? Live Births  ?   ?   ?  ?  ? ? ?Past Medical History:  ?Diagnosis Date  ? Anxiety   ? Exacerbated by pregnancy; also excessive by grandfather in ICU.  ? Headache   ? ? ?Past Surgical History:  ?Procedure Laterality Date  ? None    ? ? ?Family History  ?Problem Relation Age of Onset  ? Aneurysm Mother   ? Hypertension Father   ? Diabetes Father   ? Healthy Sister   ? Healthy Brother   ? Aneurysm Maternal Grandmother   ? Stroke Maternal Grandfather   ? Cancer Maternal Grandfather   ? Healthy Paternal Grandmother   ? Healthy Maternal Aunt   ? ? ?Social History  ? ?Tobacco Use  ? Smoking status: Never  ? Smokeless tobacco: Never  ?Vaping Use  ? Vaping Use: Former  ?Substance Use Topics  ? Alcohol use: Not Currently  ? Drug use: Never  ? ? ?Allergies: No Known Allergies ? ?Medications Prior to Admission  ?Medication Sig Dispense Refill Last Dose  ? docusate sodium (COLACE) 250 MG capsule Take 250 mg by mouth daily.     ? ferrous sulfate (FERROUSUL) 325 (65 FE) MG tablet Take 1 tablet (325 mg total) by mouth every other day. 35 tablet 0 Past Week  ? Prenatal Vit-Fe Fumarate-FA (PRENATAL MULTIVITAMIN) TABS tablet Take  1 tablet by mouth daily at 12 noon.   11/14/2021  ? cyclobenzaprine (FLEXERIL) 10 MG tablet Take 1 tablet (10 mg total) by mouth 2 (two) times daily as needed for muscle spasms. 20 tablet 0   ? pantoprazole (PROTONIX) 20 MG tablet Take 1 tablet (20 mg total) by mouth daily. 30 tablet 3   ? ? ?Review of Systems ?Physical Exam  ? ?Blood pressure (!) 96/45, pulse 96, temperature 97.7 ?F (36.5 ?C), temperature source Oral, resp. rate 14, height 5' (1.524 m), weight 62.6 kg, last menstrual period 02/25/2021, SpO2 100 %. ? ?Physical Exam ? ?General: alert, cooperative and in acute distress. ?Skin: Skin is warm and dry. No rash noted. ?Cardiovascular: Normal heart sounds ?Respiratory:  no respiratory distress, lungs are clear, no wheeze, rales or crackles.  ?Abdomen: soft, tender in RLQ, gravid appropriated for gestational age.  Right back tender to palpation. ?Pelvic: Cervical exam deferred. Presence of white discharge in vagina ?Extremities: No edema ?Mental Status: Normal mood and affect.  Normal behavior. Normal judgment and thought content. ?  ?MAU Course  ?Procedures ? ?MDM ?High ? ?Assessment and Plan  ?Joy Dyer is a 21 y/o G1P0 at [place]: 37w 4d  ? ?Apendicitis? ?37 week pregnancy ?Pre labor ? ? ?Cyndia Diver ?PA [employer] Student ?11/15/2021, 4:28 PM  ? ? ?PA student  attestation: ? ?I have seen and examined this patient and agree with above documentation in the PA student's note.  ? ?Joy Dyer is a 21 y/o G1P0 at GA: 37w 4d presents today with right back pain in the last 4 days that radiates to the RLQ, associated with nausea, vomiting and decreased appetite. She came to MAU yesterday for the same complaints.  She had Renal US that showed mild bilateral hydronephrosis.No renal stones. She was discharged home with pain medicine and reports the pain worsened.  She reports contractions and white vaginal discharge, not  vaginal itching.  ? ?Associated symptoms:  ?Negative fever and chills ?Positive abdominal pain ?Negative  vaginal bleeding ?Positive vaginal discharge ?Negative urinary complaints ?Positive GI complaints ? ?PE: ?Patient Vitals for the past 24 hrs: ? BP Temp Temp src Pulse Resp SpO2 Height Weight  ?11/15/21 1711 (!) 110/58 -- -- 82 -- -- -- --  ?11/15/21 1619 (!) 96/45 97.7 ?F (36.5 ?C) Oral 96 14 100 % -- --  ?11/15/21 1425 -- -- -- -- -- 99 % -- --  ?11/15/21 1422 -- -- -- -- -- 99 % -- --  ?11/15/21 1419 115/68 -- -- -- -- -- -- --  ?11/15/21 1344 131/75 97.9 ?F (36.6 ?C) -- 82 18 -- 5' (1.524 m) 62.6 kg  ? ?Gen: appears in mild distress  ?Resp: normal effort ?Heart: Regular rate ?Abd: Soft, NT, Pos BS x 4, + right CVA tenderness, no rebound tenderness.  ?Neuro: A&O x 4 ?Pelvic exam: ? ?Dilation: 1 ?Effacement (%): Thick ?Cervical Position: Posterior ?Station: -1, -2 ?Presentation: Undeterminable ?Exam by:: Venia Carbon, NP  ?Vertex position confirmed by limited US  ? ?MR PELVIS WO CONTRAST ? ?Result Date: 11/15/2021 ?CLINICAL DATA:  Right lower quadrant abdominal pain. EXAM: MRI ABDOMEN WITHOUT CONTRAST TECHNIQUE: Multiplanar multisequence MR imaging was performed without the administration of intravenous contrast. COMPARISON:  Ultrasound 11/14/2021. FINDINGS: Examination is somewhat limited. The patient was in a lot of pain and could not complete the examination. Lower chest: The visualized lung bases are grossly clear. No pleural effusion. Hepatobiliary: No hepatic lesions or intrahepatic biliary dilatation. The gallbladder appears normal. Normal caliber and course of the common bile duct. Pancreas:  No mass, inflammation or ductal dilatation. Spleen:  Normal size.  No focal lesions. Adrenals/Urinary Tract:  The adrenal glands are unremarkable. Moderate to marked right-sided hydronephrosis and mild to moderate left hydronephrosis. There is also right-sided perinephric fluid and fluid in the hepatic renal fossa. Stomach/Bowel: Stomach, duodenum, small bowel and colon grossly. Do not see any findings suspicious for  acute appendicitis. Vascular/Lymphatic: No significant findings. Expected prominent gonadal veins. Other: Gravid uterus with advanced age fetus. Normal appearance of the posterior placenta and normal appearing amniotic volume subjectively. No gross fetal abnormalities. Scattered free abdominopelvic fluid is noted. Musculoskeletal: No significant bony findings. IMPRESSION: 1. Limited examination. 2. Moderate to marked right-sided hydronephrosis and mild to moderate left hydronephrosis. There is also right-sided perinephric fluid and fluid in the hepatorenal fossa. 3. No findings suspicious for acute appendicitis. 4. Gravid uterus with advanced age fetus. Normal appearance of the posterior placenta and normal appearing amniotic volume subjectively. 5. Scattered free abdominopelvic fluid.  Electronically Signed   By: Rudie MeyerP.  Gallerani M.D.   On: 11/15/2021 18:57  ? ?MR ABDOMEN WO CONTRAST ? ?Result Date: 11/15/2021 ?CLINICAL DATA:  Right lower quadrant abdominal pain. EXAM: MRI ABDOMEN WITHOUT CONTRAST TECHNIQUE: Multiplanar multisequence MR imaging was performed without the administration of intravenous contrast. COMPARISON:  Ultrasound 11/14/2021. FINDINGS: Examination is somewhat limited. The patient was in a lot of pain and could not complete the examination. Lower chest: The visualized lung bases are grossly clear. No pleural effusion. Hepatobiliary: No hepatic lesions or intrahepatic biliary dilatation. The gallbladder appears normal. Normal caliber and course of the common bile duct. Pancreas:  No mass, inflammation or ductal dilatation. Spleen:  Normal size.  No focal lesions. Adrenals/Urinary Tract:  The adrenal glands are unremarkable. Moderate to marked right-sided hydronephrosis and mild to moderate left hydronephrosis. There is also right-sided perinephric fluid and fluid in the hepatic renal fossa. Stomach/Bowel: Stomach, duodenum, small bowel and colon grossly. Do not see any findings suspicious for acute  appendicitis. Vascular/Lymphatic: No significant findings. Expected prominent gonadal veins. Other: Gravid uterus with advanced age fetus. Normal appearance of the posterior placenta and normal appearing a

## 2021-11-16 ENCOUNTER — Inpatient Hospital Stay (HOSPITAL_COMMUNITY): Payer: Medicaid Other | Admitting: Anesthesiology

## 2021-11-16 LAB — RPR: RPR Ser Ql: NONREACTIVE

## 2021-11-16 MED ORDER — BUTORPHANOL TARTRATE 1 MG/ML IJ SOLN
1.0000 mg | Freq: Once | INTRAMUSCULAR | Status: AC
  Administered 2021-11-16: 1 mg via INTRAVENOUS
  Filled 2021-11-16: qty 1

## 2021-11-16 MED ORDER — LACTATED RINGERS IV SOLN
500.0000 mL | Freq: Once | INTRAVENOUS | Status: AC
  Administered 2021-11-16: 500 mL via INTRAVENOUS

## 2021-11-16 MED ORDER — FENTANYL-BUPIVACAINE-NACL 0.5-0.125-0.9 MG/250ML-% EP SOLN
12.0000 mL/h | EPIDURAL | Status: DC | PRN
  Administered 2021-11-16 – 2021-11-17 (×2): 12 mL/h via EPIDURAL
  Filled 2021-11-16 (×2): qty 250

## 2021-11-16 MED ORDER — MISOPROSTOL 50MCG HALF TABLET
50.0000 ug | ORAL_TABLET | ORAL | Status: DC | PRN
  Administered 2021-11-16 (×2): 50 ug via BUCCAL
  Filled 2021-11-16 (×2): qty 1

## 2021-11-16 MED ORDER — FENTANYL-BUPIVACAINE-NACL 0.5-0.125-0.9 MG/250ML-% EP SOLN
EPIDURAL | Status: AC
  Filled 2021-11-16: qty 250

## 2021-11-16 MED ORDER — PHENYLEPHRINE 40 MCG/ML (10ML) SYRINGE FOR IV PUSH (FOR BLOOD PRESSURE SUPPORT): 80.0000 ug | PREFILLED_SYRINGE | INTRAVENOUS | Status: DC | PRN

## 2021-11-16 MED ORDER — EPHEDRINE 5 MG/ML INJ: 10.0000 mg | INTRAVENOUS | Status: DC | PRN

## 2021-11-16 MED ORDER — DIPHENHYDRAMINE HCL 50 MG/ML IJ SOLN: 12.5000 mg | INTRAMUSCULAR | Status: DC | PRN

## 2021-11-16 MED ORDER — LIDOCAINE HCL (PF) 1 % IJ SOLN
INTRAMUSCULAR | Status: DC | PRN
  Administered 2021-11-16 (×2): 5 mL via EPIDURAL

## 2021-11-16 MED ORDER — ZOLPIDEM TARTRATE 5 MG PO TABS
5.0000 mg | ORAL_TABLET | Freq: Every evening | ORAL | Status: DC | PRN
  Administered 2021-11-16: 5 mg via ORAL
  Filled 2021-11-16: qty 1

## 2021-11-16 NOTE — Progress Notes (Signed)
Joy Dyer is a 21 y.o. G1P0 at [redacted]w[redacted]d admitted for induction of labor due to ?concealed placental abruption vs. Possible Renal stone (very excruciating abdominal pain) ? ?Subjective: ?Reports she feels comfortable at this time. Denies any bleeding or contraction pain. We discussed check and possible AROM. Patient would like to see what check is and then decide ? ?Objective: ?BP 104/62   Pulse 84   Temp 99.8 ?F (37.7 ?C) (Oral)   Resp 16   Ht 5' (1.524 m)   Wt 62.6 kg   LMP 02/25/2021   SpO2 98%   BMI 26.95 kg/m?  ?I/O last 3 completed shifts: ?In: 848.7 [I.V.:848.7] ?Out: 850 [Urine:850] ?No intake/output data recorded. ? ?FHT:  FHR: 145 bpm, variability: moderate,  accelerations:  Present,  decelerations:  Absent ?UC:   regular, every 1 minute ?SVE:   Dilation: 4 ?Effacement (%): 70 ?Station: -2 ?Exam by:: Ephriam Jenkins ? ?Labs: ?Lab Results  ?Component Value Date  ? WBC 11.5 (H) 11/15/2021  ? HGB 10.5 (L) 11/15/2021  ? HCT 32.2 (L) 11/15/2021  ? MCV 86.8 11/15/2021  ? PLT 390 11/15/2021  ? ? ?Assessment / Plan: ?G1P0 at [redacted]w[redacted]d admitted for induction of labor due to ?concealed placental abruption vs. Possible Renal stone (very excruciating abdominal pain). Pain now well controlled on epidural ? ?Labor:  cervix now 4 cm and 70% thinned out after FB. Contracting every min. Head well applied especially during contraction. Discussed and recommended AROM at this time. Patient hesitant. We discussed risks as well as benefits including labor progress once AROMed. Patient would like to hold off until next check. Will plan to start pitocin if contractions space out.  ? ?Fetal Wellbeing:  Category I ?Pain Control:  Epidural  ?I/D:   GBS negative ? ? ? ? ?Warner Mccreedy ?11/16/2021, 8:34 PM ? ? ?

## 2021-11-16 NOTE — Progress Notes (Signed)
Labor Progress Note ?Joy Dyer is a 21 y.o. G1P0 at [redacted]w[redacted]d presented for IOL due to persistent severe back/flank pain.  ? ?S: Called by RN that patient is extremely uncomfortable and requesting more medication. She is already s/p 7 doses of fentanyl (MAU + L&D) and Ambien for sleep.  ? ?Reports her pain is now more in left flank compared to right. Sharp and constant. She felt contractions earlier with the cytotec but states this doesn't feel like that. Standing at the edge of her bed with her elbows on the bed rocking back and forth during this time but able to converse in normal conversation without difficulty.  ? ?O:  ?BP 112/65   Pulse 87   Temp 98.9 ?F (37.2 ?C) (Oral)   Resp 16   Ht 5' (1.524 m)   Wt 62.6 kg   LMP 02/25/2021   SpO2 99%   BMI 26.95 kg/m?  ?EFM: 120/mod/15x15/none ? ?CVE: Dilation: 1 ?Effacement (%): Thick ?Cervical Position: Posterior ?Station: -2 ?Presentation: Vertex ?Exam by:: Chriss Driver, RN ? ? ?A&P: 21 y.o. G1P0 [redacted]w[redacted]d  ?#Labor: Checked by RN about two hours ago while I was in another delivery and cervix was unchanged despite having frequent uncomfortable contractions s/p cytotec x1. Discussed options with patient including trial of stadol vs early epidural, she would like to try stadol first. Plan to recheck after receiving stadol dose and continue IOL (do not believe patient would tolerate a FB currently). Set expectations that complete pain relief may not be achievable, but closest would be the epidural when she is ready.   ?#FWB: Cat I  ?#GBS negative ? ? ?Allayne Stack, DO ?5:54 AM  ?

## 2021-11-16 NOTE — Progress Notes (Signed)
Joy Dyer is a 21 y.o. G1P0 at [redacted]w[redacted]d by LMP admitted for induction of labor due to severe back/right lower abdominal pain with irritability to rule out placental abruption. ? ?Subjective: ?Patient lying in bed with father at bedside. Patient states she's doing well at the time and is comfortable with epidural. Discussed methods to progress with induction, as she remains 1cm dilated. Discussed another dose of Cytotec buccally, pending review of contraction frequency. Discussed FB at length with patient and discussed indications and FB mechanism. Patient stated she would like to proceed with another dose of Cytotec and will decide about FB at next recheck.  ? ?Objective: ?BP (!) 94/44   Pulse 77   Temp 98.2 ?F (36.8 ?C) (Oral)   Resp 16   Ht 5' (1.524 m)   Wt 62.6 kg   LMP 02/25/2021   SpO2 98%   BMI 26.95 kg/m?  ?I/O last 3 completed shifts: ?In: 848.7 [I.V.:848.7] ?Out: -  ?No intake/output data recorded. ? ?FHT:  FHR: 125 bpm, variability: moderate,  accelerations:  Present,  decelerations:  Absent ?UC:   irregular ?SVE:   Dilation: 1 ?Effacement (%): 60 ?Station: -3 ?Exam by:: Dr. Ephriam Jenkins ? ?Labs: ?Lab Results  ?Component Value Date  ? WBC 11.5 (H) 11/15/2021  ? HGB 10.5 (L) 11/15/2021  ? HCT 32.2 (L) 11/15/2021  ? MCV 86.8 11/15/2021  ? PLT 390 11/15/2021  ? ? ?Assessment / Plan: ?Induction of labor due to concern for placental abruption. Progressing with Cytotec. ? ?Labor:  Progressing with Cytotec. Plan for FB. ?Fetal Wellbeing:  Category I ?Pain Control:  Epidural and IV pain meds ?I/D:   GBS negative ?Anticipated MOD:  NSVD ? ?Pearletha Alfred, PA-S2 ?11/16/2021, 12:10 PM ? ? ?

## 2021-11-16 NOTE — Anesthesia Procedure Notes (Signed)
Epidural ?Patient location during procedure: OB ?Start time: 11/16/2021 8:43 AM ?End time: 11/16/2021 8:01 AM ? ?Staffing ?Anesthesiologist: Heather Roberts, MD ?Performed: anesthesiologist  ? ?Preanesthetic Checklist ?Completed: patient identified, IV checked, site marked, risks and benefits discussed, monitors and equipment checked, pre-op evaluation and timeout performed ? ?Epidural ?Patient position: sitting ?Prep: DuraPrep ?Patient monitoring: heart rate, cardiac monitor, continuous pulse ox and blood pressure ?Approach: midline ?Location: L2-L3 ?Injection technique: LOR saline ? ?Needle:  ?Needle type: Tuohy  ?Needle gauge: 17 G ?Needle length: 9 cm ?Needle insertion depth: 5 cm ?Catheter size: 20 Guage ?Catheter at skin depth: 10 cm ?Test dose: negative and Other ? ?Assessment ?Events: blood not aspirated, injection not painful, no injection resistance and negative IV test ? ?Additional Notes ?Informed consent obtained prior to proceeding including risk of failure, 1% risk of PDPH, risk of minor discomfort and bruising.  Discussed rare but serious complications including epidural abscess, permanent nerve injury, epidural hematoma.  Discussed alternatives to epidural analgesia and patient desires to proceed.  Timeout performed pre-procedure verifying patient name, procedure, and platelet count.  Patient tolerated procedure well. ? ? ? ? ?

## 2021-11-16 NOTE — Progress Notes (Signed)
Patient comfortable with stadol. Cervix checked and unchanged. Second vaginal cytotec placed. FHT Cat I.  ? ?Allayne Stack, DO  ?

## 2021-11-16 NOTE — Anesthesia Preprocedure Evaluation (Signed)
Anesthesia Evaluation  ?Patient identified by MRN, date of birth, ID band ?Patient awake ? ? ? ?Reviewed: ?Allergy & Precautions, NPO status , Patient's Chart, lab work & pertinent test results ? ?Airway ?Mallampati: II ? ?TM Distance: >3 FB ?Neck ROM: Full ? ? ? Dental ?no notable dental hx. ?(+) Dental Advisory Given ?  ?Pulmonary ?neg pulmonary ROS,  ?  ?Pulmonary exam normal ? ? ? ? ? ? ? Cardiovascular ?negative cardio ROS ?Normal cardiovascular exam ? ? ?  ?Neuro/Psych ?PSYCHIATRIC DISORDERS Anxiety negative neurological ROS ?   ? GI/Hepatic ?negative GI ROS, Neg liver ROS,   ?Endo/Other  ?negative endocrine ROS ? Renal/GU ?negative Renal ROS  ?negative genitourinary ?  ?Musculoskeletal ?negative musculoskeletal ROS ?(+)  ? Abdominal ?  ?Peds ?negative pediatric ROS ?(+)  Hematology ? ?(+) Blood dyscrasia, anemia ,   ?Anesthesia Other Findings ? ? Reproductive/Obstetrics ?(+) Pregnancy ? ?  ? ? ? ? ? ? ? ? ? ? ? ? ? ?  ?  ? ? ? ? ? ? ? ? ?Anesthesia Physical ?Anesthesia Plan ? ?ASA: 2 ? ?Anesthesia Plan: Epidural  ? ?Post-op Pain Management:   ? ?Induction:  ? ?PONV Risk Score and Plan:  ? ?Airway Management Planned:  ? ?Additional Equipment:  ? ?Intra-op Plan:  ? ?Post-operative Plan:  ? ?Informed Consent: I have reviewed the patients History and Physical, chart, labs and discussed the procedure including the risks, benefits and alternatives for the proposed anesthesia with the patient or authorized representative who has indicated his/her understanding and acceptance.  ? ? ? ?Dental advisory given ? ?Plan Discussed with: Anesthesiologist ? ?Anesthesia Plan Comments:   ? ? ? ? ? ? ?Anesthesia Quick Evaluation ? ?

## 2021-11-17 ENCOUNTER — Encounter (HOSPITAL_COMMUNITY): Payer: Self-pay | Admitting: Obstetrics & Gynecology

## 2021-11-17 DIAGNOSIS — Z3A37 37 weeks gestation of pregnancy: Secondary | ICD-10-CM

## 2021-11-17 LAB — CULTURE, OB URINE: Culture: 20000 — AB

## 2021-11-17 MED ORDER — OXYTOCIN-SODIUM CHLORIDE 30-0.9 UT/500ML-% IV SOLN
1.0000 m[IU]/min | INTRAVENOUS | Status: DC
  Administered 2021-11-17: 2 m[IU]/min via INTRAVENOUS

## 2021-11-17 MED ORDER — ONDANSETRON HCL 4 MG/2ML IJ SOLN: 4.0000 mg | INTRAMUSCULAR | Status: DC | PRN

## 2021-11-17 MED ORDER — TERBUTALINE SULFATE 1 MG/ML IJ SOLN: 0.2500 mg | Freq: Once | INTRAMUSCULAR | Status: DC | PRN

## 2021-11-17 MED ORDER — ONDANSETRON HCL 4 MG PO TABS: 4.0000 mg | ORAL_TABLET | ORAL | Status: DC | PRN

## 2021-11-17 MED ORDER — TETANUS-DIPHTH-ACELL PERTUSSIS 5-2.5-18.5 LF-MCG/0.5 IM SUSY: 0.5000 mL | PREFILLED_SYRINGE | Freq: Once | INTRAMUSCULAR | Status: DC

## 2021-11-17 MED ORDER — COCONUT OIL OIL
1.0000 "application " | TOPICAL_OIL | Status: DC | PRN
  Administered 2021-11-19: 1 via TOPICAL

## 2021-11-17 MED ORDER — BUPIVACAINE HCL (PF) 0.25 % IJ SOLN
INTRAMUSCULAR | Status: DC | PRN
  Administered 2021-11-17: 10 mL via EPIDURAL

## 2021-11-17 MED ORDER — LIDOCAINE HCL URETHRAL/MUCOSAL 2 % EX GEL
1.0000 "application " | Freq: Once | CUTANEOUS | Status: AC
  Administered 2021-11-17: 1 via TOPICAL
  Filled 2021-11-17: qty 6

## 2021-11-17 MED ORDER — FLEET ENEMA 7-19 GM/118ML RE ENEM: 1.0000 | ENEMA | Freq: Once | RECTAL | Status: DC

## 2021-11-17 MED ORDER — SIMETHICONE 80 MG PO CHEW: 80.0000 mg | CHEWABLE_TABLET | ORAL | Status: DC | PRN

## 2021-11-17 MED ORDER — SENNOSIDES-DOCUSATE SODIUM 8.6-50 MG PO TABS
2.0000 | ORAL_TABLET | Freq: Every day | ORAL | Status: DC
  Administered 2021-11-18: 2 via ORAL
  Filled 2021-11-17: qty 2

## 2021-11-17 MED ORDER — PRENATAL MULTIVITAMIN CH
1.0000 | ORAL_TABLET | Freq: Every day | ORAL | Status: DC
  Administered 2021-11-18: 1 via ORAL
  Filled 2021-11-17: qty 1

## 2021-11-17 MED ORDER — FAMOTIDINE IN NACL 20-0.9 MG/50ML-% IV SOLN
20.0000 mg | Freq: Once | INTRAVENOUS | Status: AC
  Administered 2021-11-17: 20 mg via INTRAVENOUS
  Filled 2021-11-17: qty 50

## 2021-11-17 MED ORDER — DIBUCAINE (PERIANAL) 1 % EX OINT: 1.0000 "application " | TOPICAL_OINTMENT | CUTANEOUS | Status: DC | PRN

## 2021-11-17 MED ORDER — WITCH HAZEL-GLYCERIN EX PADS
1.0000 "application " | MEDICATED_PAD | CUTANEOUS | Status: DC | PRN
  Administered 2021-11-17: 1 via TOPICAL

## 2021-11-17 MED ORDER — IBUPROFEN 600 MG PO TABS
600.0000 mg | ORAL_TABLET | Freq: Four times a day (QID) | ORAL | Status: DC
  Administered 2021-11-17 – 2021-11-19 (×6): 600 mg via ORAL
  Filled 2021-11-17 (×6): qty 1

## 2021-11-17 MED ORDER — BENZOCAINE-MENTHOL 20-0.5 % EX AERO
1.0000 "application " | INHALATION_SPRAY | Freq: Four times a day (QID) | CUTANEOUS | Status: DC | PRN
  Administered 2021-11-17: 1 via TOPICAL
  Filled 2021-11-17: qty 56

## 2021-11-17 MED ORDER — ACETAMINOPHEN 325 MG PO TABS: 650.0000 mg | ORAL_TABLET | ORAL | Status: DC | PRN

## 2021-11-17 MED ORDER — BENZOCAINE-MENTHOL 20-0.5 % EX AERO
1.0000 "application " | INHALATION_SPRAY | CUTANEOUS | Status: DC | PRN
  Administered 2021-11-17: 1 via TOPICAL
  Filled 2021-11-17: qty 56

## 2021-11-17 MED ORDER — DIPHENHYDRAMINE HCL 25 MG PO CAPS: 25.0000 mg | ORAL_CAPSULE | Freq: Four times a day (QID) | ORAL | Status: DC | PRN

## 2021-11-17 NOTE — Progress Notes (Signed)
Joy Dyer is a 21 y.o. G1P0 at [redacted]w[redacted]d by LMP admitted for induction of labor due to severe back/right lower abdominal pain with inability to rule out placental abruption. ? ?Subjective: ?Resting comfortably, feeling some epigastric/midline chest pain associated with nausea. Feeling more pressure with contractions.   ? ?Objective: ?BP 114/66   Pulse (!) 119   Temp 99.7 ?F (37.6 ?C) (Oral)   Resp 16   Ht 5' (1.524 m)   Wt 62.6 kg   LMP 02/25/2021   SpO2 98%   BMI 26.95 kg/m?  ?I/O last 3 completed shifts: ?In: 848.7 [I.V.:848.7] ?Out: 1400 [Urine:1400] ?No intake/output data recorded. ? ?FHT:  FHR: 150 bpm, variability: moderate,  accelerations:  Present,  decelerations:  Absent ?UC:   irregular, every 2-5 minutes ?SVE:   Dilation: 4 ?Effacement (%): 70 ?Station: -2 ?Exam by:: Rayfield Citizen, CNM ? ?Labs: ?Lab Results  ?Component Value Date  ? WBC 11.5 (H) 11/15/2021  ? HGB 10.5 (L) 11/15/2021  ? HCT 32.2 (L) 11/15/2021  ? MCV 86.8 11/15/2021  ? PLT 390 11/15/2021  ? ? ?Assessment / Plan: ?Induction of labor due to severe back/right lower abdominal pain with inability to rule out placental abruption. ? ?Labor:  Plan to continue uptitrating Pitocin. If no change at next check will discuss IUPC placement. ?Fetal Wellbeing:  Category I ?Pain Control:  Epidural ?I/D:   GBS negative ?Anticipated MOD:  NSVD ? ?Theresia Majors ?11/17/2021, 8:58 AM ? ? ?

## 2021-11-17 NOTE — Discharge Summary (Signed)
? ?  Postpartum Discharge Summary ? ?Date of Service updated ? ?   ?Patient Name: Shadow Stiggers ?DOB: [birth year] ?MRN: [id number] ? ?Date of admission: 11/15/2021 ?Delivery date:11/17/2021  ?Delivering provider: Eulas Post, ADAM J  ?Date of discharge: 11/19/2021 ? ?Admitting diagnosis: Intrauterine normal pregnancy [Z34.90] ?Intrauterine pregnancy: [redacted]w[redacted]d    ?Secondary diagnosis:  Principal Problem: ?  Intrauterine normal pregnancy ?Active Problems: ?  Abdominal pain affecting pregnancy ? ?Additional problems: None    ?Discharge diagnosis: Term Pregnancy Delivered                                              ?Post partum procedures: None ?Augmentation: AROM, Pitocin, Cytotec, and IP Foley ?Complications: None ? ?Hospital course: Induction of Labor With Vaginal Delivery   ?21y.o. yo G1P0 at 363w6das admitted to the hospital 11/15/2021 for induction of labor.  Indication for induction:  unexplained abdominal pain and could not r/o placental abruption.  Patient had an uncomplicated labor course as follows: ?Membrane Rupture Time/Date: 4:53 AM ,11/17/2021   ?Delivery Method:Vaginal, Spontaneous  ?Episiotomy: None  ?Lacerations:  1st degree;Perineal  ?Details of delivery can be found in separate delivery note.  Patient had a routine postpartum course. Her severe abdominal pain resolved after delivery of infant. Patient is discharged home 11/19/21. ? ?Newborn Data: ?Birth date:11/17/2021  ?Birth time:7:24 PM  ?Gender:Female  ?Living status:Living  ?Apgars:7 ,8  ?Weight:3240 g  ? ?Magnesium Sulfate received: No ?BMZ received: No ?Rhophylac:N/A ?MMR:No ?T-DaP:Given prenatally ?Flu: No ?Transfusion:No ? ?Physical exam  ?Vitals:  ? 11/18/21 1030 11/18/21 1514 11/18/21 2130 11/19/21 [id number]?BP: 103/64 (!) 104/59 109/62 98/61  ?Pulse: (!) 59 (!) 53 67 66  ?Resp:  16 16 16   ?Temp: (!) 97.4 ?F (36.3 ?C) 97.6 ?F (36.4 ?C) 97.6 ?F (36.4 ?C) 97.8 ?F (36.6 ?C)  ?TempSrc: Oral Oral Oral Oral  ?SpO2:  99%  100%  ?Weight:      ?Height:      ? ?General:  alert, cooperative, and no distress ?Lochia: appropriate ?Uterine Fundus: firm ?Incision: N/A ?DVT Evaluation: 1+ pitting edema to mid calf bilaterally  ?Labs: ?Lab Results  ?Component Value Date  ? WBC 11.5 (H) 11/15/2021  ? HGB 10.5 (L) 11/15/2021  ? HCT 32.2 (L) 11/15/2021  ? MCV 86.8 11/15/2021  ? PLT 390 11/15/2021  ? ? ?  Latest Ref Rng & Units 11/15/2021  ?  3:34 PM  ?CMP  ?Glucose 70 - 99 mg/dL 85    ?BUN 6 - 20 mg/dL 5    ?Creatinine 0.44 - 1.00 mg/dL 0.72    ?Sodium 135 - 145 mmol/L 139    ?Potassium 3.5 - 5.1 mmol/L 3.4    ?Chloride 98 - 111 mmol/L 108    ?CO2 22 - 32 mmol/L 23    ?Calcium 8.9 - 10.3 mg/dL 8.8    ?Total Protein 6.5 - 8.1 g/dL 6.4    ?Total Bilirubin 0.3 - 1.2 mg/dL 0.4    ?Alkaline Phos 38 - 126 U/L 127    ?AST 15 - 41 U/L 17    ?ALT 0 - 44 U/L 12    ? ?Edinburgh Score: ?   ? View : No data to display.  ?  ?  ?  ? ? ? ?After visit meds:  ?Allergies as of 11/19/2021   ?No Known Allergies ?  ? ?  ?  Medication List  ?  ? ?STOP taking these medications   ? ?cyclobenzaprine 10 MG tablet ?Commonly known as: FLEXERIL ?  ? ?  ? ?TAKE these medications   ? ?acetaminophen 500 MG tablet ?Commonly known as: TYLENOL ?Take 2 tablets (1,000 mg total) by mouth every 6 (six) hours as needed. ?  ?docusate sodium 250 MG capsule ?Commonly known as: COLACE ?Take 250 mg by mouth daily. ?  ?ferrous sulfate 325 (65 FE) MG tablet ?Commonly known as: FerrouSul ?Take 1 tablet (325 mg total) by mouth every other day. ?  ?ibuprofen 600 MG tablet ?Commonly known as: ADVIL ?Take 1 tablet (600 mg total) by mouth every 6 (six) hours as needed. ?  ?pantoprazole 20 MG tablet ?Commonly known as: Protonix ?Take 1 tablet (20 mg total) by mouth daily. ?  ?prenatal multivitamin Tabs tablet ?Take 1 tablet by mouth daily at 12 noon. ?  ? ?  ? ? ? ?Discharge home in stable condition ?Infant Feeding: Breast ?Infant Disposition:home with mother ?Discharge instruction: per After Visit Summary and Postpartum booklet. ?Activity: Advance as  tolerated. Pelvic rest for 6 weeks.  ?Diet: routine diet ?Future Appointments: ?Future Appointments  ?Date Time Provider [place]  ?11/20/2021  9:35 AM Anyanwu, Sallyanne Havers, MD [facility]  ?11/27/2021  9:35 AM Anyanwu, Sallyanne Havers, MD [facility]  ?12/04/2021  9:35 AM Anyanwu, Sallyanne Havers, MD [facility]  ?04/25/2022  4:20 PM Leonie Man, MD [id number]  ? ?Follow up Visit: ? ? ?Please schedule this patient for Postpartum visit in: 4 weeks with the following provider: Any provider ?In-Person ?For C/S patients schedule nurse incision check in weeks 2 weeks: no ?Low risk pregnancy complicated by: n/a ?Delivery mode:  SVD ?Anticipated Birth Control:  Depo ?PP Procedures needed: n/a  ? ?11/19/2021 ?Patriciaann Clan, DO ? ? ? ?

## 2021-11-17 NOTE — Progress Notes (Signed)
Joy Dyer is a 21 y.o. G1P0 at [redacted]w[redacted]d by LMP admitted for induction of labor due to severe back/right lower abdominal pain with inability to rule out placental abruption. ? ?Subjective: ?Continued discomfort in her rectum despite lidocaine/re-dosed epidural.  ? ?Objective: ?BP 125/61   Pulse (!) 131   Temp 100 ?F (37.8 ?C)   Resp 20   Ht 5' (1.524 m)   Wt 62.6 kg   LMP 02/25/2021   SpO2 98%   BMI 26.95 kg/m?  ?I/O last 3 completed shifts: ?In: 848.7 [I.V.:848.7] ?Out: 1400 [Urine:1400] ?No intake/output data recorded. ? ?FHT:  FHR: 145 bpm, variability: moderate,  accelerations:  Present,  decelerations:  Absent ?UC:   regular, every 2-3 minutes ?SVE:   Dilation: 8 ?Effacement (%): 100 ?Station: Plus 1 ?Exam by:: Chrys Racer, CNM ? ?Labs: ?Lab Results  ?Component Value Date  ? WBC 11.5 (H) 11/15/2021  ? HGB 10.5 (L) 11/15/2021  ? HCT 32.2 (L) 11/15/2021  ? MCV 86.8 11/15/2021  ? PLT 390 11/15/2021  ? ? ?Assessment / Plan: ?Induction of labor due to severe back/right lower abdominal pain with inability to rule out placental abruption. ? ?Labor: Continues to progress on pitocin despite inadequate MVUs ranging 100-130. Given continued discomfort will assess for replacement of epidural. Continue with pitocin.  ?Fetal Wellbeing:  Category I ?Pain Control:  Epidural ?I/D:   GBS negative ?Anticipated MOD:  NSVD ? ?Daniel Nones ?11/17/2021, 3:42 PM ? ? ?

## 2021-11-17 NOTE — Lactation Note (Signed)
This note was copied from a baby's chart. ?Lactation Consultation Note ? ?Patient Name: Joy Dyer ?Today's Date: 11/17/2021 ?Reason for consult: L&D Initial assessment;1st time breastfeeding;Early term 37-38.6wks ?Age:20 hours, ETI female infant. ?LC entered the room, mom was doing skin to skin with infant. ?Infant appeared sleep did not latch in L&D, only held nipple in mouth. ?LC observed mom nipples see latch assessment below, mom could benefit from breast shells and hand pump to help evert nipple shaft out more prior to latching infant at the breast. ?LC used breast model and mom self expressed 2 mls of colostrum that was spoon fed to infant.  ?Mom knows to breastfeed infant according to primal cues, 8 to 12 times within 24 hours, skin to skin. ?Mom will continue to work towards latching infant at the breast and will ask for further latch assistance on MBU from RN/LC. ?Mom knows if infant continue not to latch to do lots of skin to skin and hand express. ?LC congratulated parents on the birth of their daughter. ?Maternal Data ?Has patient been taught Hand Expression?: Yes ? ?Feeding ?Mother's Current Feeding Choice: Breast Milk ? ?LATCH Score ?Latch: Too sleepy or reluctant, no latch achieved, no sucking elicited. ? ?Audible Swallowing: None ? ?Type of Nipple: Flat ? ?Comfort (Breast/Nipple): Soft / non-tender ? ?Hold (Positioning): Assistance needed to correctly position infant at breast and maintain latch. ? ?LATCH Score: 4 ? ? ?Lactation Tools Discussed/Used ?  ? ?Interventions ?Interventions: Assisted with latch;Skin to skin;Breast compression;Adjust position;Support pillows;Position options;Education ? ?Discharge ?  ? ?Consult Status ?Consult Status: Follow-up from L&D ? ? ? ?Danelle Earthly ?11/17/2021, 8:32 PM ? ? ? ?

## 2021-11-17 NOTE — Progress Notes (Signed)
Joy Dyer is a 21 y.o. G1P0 at [redacted]w[redacted]d by LMP admitted for induction of labor due to severe back/right lower abdominal pain with inability to rule out placental abruption. ? ?Subjective: ?Continued discomfort in her bottom. Amenable to cervical check with IUPC placement.  ? ?Objective: ?BP 122/74   Pulse 100   Temp 99.3 ?F (37.4 ?C) (Oral)   Resp 20   Ht 5' (1.524 m)   Wt 62.6 kg   LMP 02/25/2021   SpO2 98%   BMI 26.95 kg/m?  ?I/O last 3 completed shifts: ?In: 848.7 [I.V.:848.7] ?Out: 1400 [Urine:1400] ?No intake/output data recorded. ? ?FHT:  FHR: 140 bpm, variability: moderate,  accelerations:  Present,  decelerations:  Absent ?UC:   regular, every 2-3 minutes ?SVE:   Dilation: 5.5 ?Effacement (%): 80 ?Station: -1 ?Exam by:: Dr. Eulas Post ? ?Labs: ?Lab Results  ?Component Value Date  ? WBC 11.5 (H) 11/15/2021  ? HGB 10.5 (L) 11/15/2021  ? HCT 32.2 (L) 11/15/2021  ? MCV 86.8 11/15/2021  ? PLT 390 11/15/2021  ? ? ?Assessment / Plan: ?Induction of labor due to severe back/right lower abdominal pain with inability to rule out placental abruption. ? ?Labor: Has made cervical change since the last check, placed IUPC to ensure contractions are adequate with current level of pitocin. If not adequate will continue increasing pit.  ? ?Rectal pain: Trial of lidocaine gel/spray for topical relief ? ?Fetal Wellbeing:  Category I ?Pain Control:  Epidural ?I/D:   GBS negative ?Anticipated MOD:  NSVD ? ?Joy Dyer ?11/17/2021, 12:50 PM ? ? ?

## 2021-11-17 NOTE — Progress Notes (Signed)
Joy Dyer is a 21 y.o. G1P0 at [redacted]w[redacted]d by LMP admitted for induction of labor due to severe back/right lower abdominal pain with inability to rule out placental abruption. ? ?Subjective: ?Called to assess rectal pain which intensified with contractions. Epidural recently re-dosed. Patient feeling sharp rectal pain which improved with ice applied to rectum.  ? ?Objective: ?BP 105/67   Pulse (!) 103   Temp 99.4 ?F (37.4 ?C) (Oral)   Resp 18   Ht 5' (1.524 m)   Wt 62.6 kg   LMP 02/25/2021   SpO2 98%   BMI 26.95 kg/m?  ?I/O last 3 completed shifts: ?In: 848.7 [I.V.:848.7] ?Out: 1400 [Urine:1400] ?No intake/output data recorded. ? ?No sign of bleeding or thrombosed hemorrhoid or rectal fissure on rectal exam. ? ?FHT:  FHR: 145 bpm, variability: moderate,  accelerations:  Present,  decelerations:  Absent ?UC:   irregular, every 1-4 minutes ?SVE:   Dilation: 4 ?Effacement (%): 70 ?Station: -2 ?Exam by:: Rayfield Citizen, CNM ? ?Labs: ?Lab Results  ?Component Value Date  ? WBC 11.5 (H) 11/15/2021  ? HGB 10.5 (L) 11/15/2021  ? HCT 32.2 (L) 11/15/2021  ? MCV 86.8 11/15/2021  ? PLT 390 11/15/2021  ? ? ?Assessment / Plan: ?Induction of labor due to severe back/right lower abdominal pain with inability to rule out placental abruption. ? ?Labor: Continue uptitrating Pitocin. Check in ~1 hour unless pressure intensifies then will check sooner. Consider IUPC if minimal to no change. ?Fetal Wellbeing:  Category I ?Pain Control:  Epidural ?I/D:   GBS negative ?Anticipated MOD:  NSVD ? ?Theresia Majors ?11/17/2021, 11:44 AM ? ? ?

## 2021-11-17 NOTE — Progress Notes (Signed)
Labor Progress Note  ? ?Evaluated patient at bedside. Currently sleeping with epidural. Cervix checked and unchanged from before, but head does feel well applied.  ? ?Patient has been hesitant for AROM. Re-discussed AROM vs pit. Patient would like to start pit and see if she will spontaneously rupture. However, she is comfortable if her water doesn't break without any cervical change with the pit that she would be okay with AROM.  ? ?Pit started. FHT Cat I.  ? ?Allayne Stack, DO  ?

## 2021-11-17 NOTE — Progress Notes (Signed)
Labor Progress Note  ? ?Patient doing well, trying to rest. Pit currently at 10 with frequent contractions (every 1-2 minutes). Checked cervix and unchanged. Discussed recommendation for AROM which she agrees to.  ? ?Performed AROM with large amount of clear fluid. Head well applied before and after. Turn down pit to 6.  ? ?Will monitor closely. FHT remains Cat I.  ? ?Allayne Stack, DO  ?

## 2021-11-18 NOTE — Progress Notes (Signed)
Postpartum Day 1 ?Subjective: ?no complaints, up ad lib, voiding, tolerating PO, and + flatus ? ?Objective: ?Blood pressure 103/64, pulse (!) 59, temperature (!) 97.4 ?F (36.3 ?C), temperature source Oral, resp. rate 18, height 5' (1.524 m), weight 138 lb (62.6 kg), last menstrual period 02/25/2021, SpO2 97 %, unknown if currently breastfeeding. ? ?Physical Exam:  ?General: alert, cooperative, appears stated age, and no distress ?Lochia: appropriate ?Uterine Fundus: firm ?Incision: N/A ?DVT Evaluation: No evidence of DVT seen on physical exam. ? ?Recent Labs  ?  11/15/21 ?1534  ?HGB 10.5*  ?HCT 32.2*  ? ? ?Assessment/Plan: ?Plan for discharge tomorrow and Breastfeeding ? ? LOS: 3 days  ? ?Joy Dyer ?11/18/2021, 2:23 PM  ? ? ?

## 2021-11-18 NOTE — Lactation Note (Signed)
This note was copied from a baby's chart. ?Lactation Consultation Note ? ?Patient Name: Joy Dyer ?Today's Date: 11/18/2021 ?Reason for consult: Follow-up assessment;Early term 37-38.6wks;1st time breastfeeding ?Age:20 hours ? ?P1, Baby has been sleepy at the breast. ?Reviewed hand expression with drops expressed and assisted with latching in both cross cradle and football hold. ?Continue to hand express and give her drops prior to latching.  Encouraged STS. ? ?Maternal Data ?Has patient been taught Hand Expression?: Yes ?Does the patient have breastfeeding experience prior to this delivery?: No ? ?Feeding ?Mother's Current Feeding Choice: Breast Milk ? ?LATCH Score ?Latch: Repeated attempts needed to sustain latch, nipple held in mouth throughout feeding, stimulation needed to elicit sucking reflex. ? ?Audible Swallowing: None ? ?Type of Nipple: Everted at rest and after stimulation (short shaft) ? ?Comfort (Breast/Nipple): Soft / non-tender ? ?Hold (Positioning): Assistance needed to correctly position infant at breast and maintain latch. ? ?LATCH Score: 6 ? ? ?Lactation Tools Discussed/Used ?Tools: Pump ?Breast pump type: Manual ? ?Interventions ?Interventions: Breast feeding basics reviewed;Assisted with latch;Skin to skin;Hand express;Breast compression;Adjust position;Support pillows;Position options;Hand pump;Education ? ?Discharge ?Pump: Personal;DEBP ? ?Consult Status ?Consult Status: Follow-up ?Date: 11/11/21 ?Follow-up type: In-patient ? ? ? ?Vivianne Master Boschen ?11/18/2021, 1:04 PM ? ? ? ?

## 2021-11-18 NOTE — Anesthesia Postprocedure Evaluation (Signed)
Anesthesia Post Note ? ?Patient: Joy Dyer ? ?Procedure(s) Performed: AN AD HOC LABOR EPIDURAL ? ?  ? ?Patient location during evaluation: Mother Baby ?Anesthesia Type: Epidural ?Level of consciousness: awake and alert ?Pain management: pain level controlled ?Vital Signs Assessment: post-procedure vital signs reviewed and stable ?Respiratory status: spontaneous breathing, nonlabored ventilation and respiratory function stable ?Cardiovascular status: stable ?Postop Assessment: no headache, no backache and epidural receding ?Anesthetic complications: no ? ? ?No notable events documented. ? ?Last Vitals:  ?Vitals:  ? 11/18/21 0245 11/18/21 0546  ?BP: 112/67 107/79  ?Pulse: 64 66  ?Resp: 18 18  ?Temp: 36.6 ?C (!) 36.4 ?C  ?SpO2: 98% 97%  ?  ?Last Pain:  ?Vitals:  ? 11/18/21 0750  ?TempSrc:   ?PainSc: 0-No pain  ? ?Pain Goal: Patients Stated Pain Goal: 4 (11/16/21 0500) ? ?  ?  ?  ?  ?  ?  ?Epidural/Spinal Function Cutaneous sensation: Normal sensation (11/18/21 0750) ? ?Nesta Kimple ? ? ? ? ?

## 2021-11-18 NOTE — Progress Notes (Signed)
CSW received consult for hx of Anxiety.  CSW met with MOB to offer support and complete assessment. MOB was accompanied by her best friend. MOB granted CSW verbal permission to speak in front of her best friend about anything. MOB was welcoming, pleasant, and remained engaged during assessment. MOB reported that she experienced anxiety during pregnancy. MOB described her anxiety as being worried and scared about pregnancy/infant. MOB reported that she feels relieved now that she has delivered infant. MOB shared that she experienced a heart murmur during pregnancy which could have potentially impacted her anxiety. MOB reported that she is not currently taking medication to treat anxiety. MOB reported that she did participate in therapy through her OB provider office, which was helpful. MOB reported that she learned coping mechanisms (tapping, distracting games on phone) which have been helpful. CSW inquired about how MOB was feeling emotionally since giving birth, MOB reported that she was feeling happy. MOB presented calm and did not demonstrate any acute mental health signs/symptoms. CSW assessed for safety, MOB denied SI, HI, and domestic violence. CSW inquired about MOB's support system, MOB reported that her best friend is a support.  ? ?CSW provided education regarding the baby blues period vs. perinatal mood disorders, discussed treatment and gave resources for mental health follow up if concerns arise.  CSW recommends self-evaluation during the postpartum time period using the New Mom Checklist from Postpartum Progress and encouraged MOB to contact a medical professional if symptoms are noted at any time.   ? ?CSW provided review of Sudden Infant Death Syndrome (SIDS) precautions. MOB verbalized understanding and reported having all items needed to care for infant including a car seat and crib.  ? ?CSW identifies no further need for intervention and no barriers to discharge at this time. ? ?Abundio Miu,  LCSW ?Clinical Social Worker ?[facility] ?Cell#: [phone] ?

## 2021-11-18 NOTE — Lactation Note (Signed)
This note was copied from a baby's chart. ?Lactation Consultation Note ?Asked mom how BF is going mom stated the baby is doing a little better but she does better on the bottle and wondered why. ?Mom stated the baby gets on the breast, sucks and stops, sucks and stops and sometimes pulls away. ?Newborn feeding habits reviewed. ?Asked mom if she would call for [facility] for next feeding for latch assistance. Mom stated ok. ? ?Patient Name: Joy Dyer ?Today's Date: 11/18/2021 ?Reason for consult: Follow-up assessment;Primapara;Early term 37-38.6wks ?Age:67 hours ? ?Maternal Data ?  ? ?Feeding ?Mother's Current Feeding Choice: Breast Milk and Donor Milk ? ?LATCH Score ?  ? ?  ? ?  ? ?  ? ?  ? ?  ? ? ?Lactation Tools Discussed/Used ?  ? ?Interventions ?  ? ?Discharge ?  ? ?Consult Status ?Consult Status: Follow-up ?Date: 11/19/21 ?Follow-up type: In-patient ? ? ? ?Charyl Dancer ?11/18/2021, 11:04 PM ? ? ? ?

## 2021-11-18 NOTE — Lactation Note (Signed)
This note was copied from a baby's chart. ?Lactation Consultation Note ? ?Patient Name: Girl Genesys Coggeshall ?Today's Date: 11/18/2021 ?Reason for consult: Initial assessment;1st time breastfeeding;Early term 37-38.6wks ?Age:20 hours ?Mom requested LC services tonight on MBU. ?Mom  pre-pump breast  with hand pump prior to attempted to latch infant on her right breast using the football hold position, infant only held nipple in her mouth and sleepy. ?Infant did not latch at this time. ?Mom was given breast shells and hand pump to help evert nipple shaft out more see latch score for nipple assessment. ?Mom hand expressed 1 ml of colostrum that was spoon fed to infant and mom used hand pump and expressed 2 mls colostrum, infant given total of 3 mls of colostrum at this time. ?LC observe infant has recessive chin and sucks her bottom lip inward. ?LC encouraged mom to continue working with latching infant at the breast, asking RN/LC for further latch assistance. ?To do lots of skin to skin with infant , BF infant according to hunger cues, 8 to 12+ or more times within 24 hours. ?Mom know if infant doesn't latch to do hand expression or use hand pump and give infant back her EBM. ?LC mention to mom that donor breast milk is available as breastfeeding supplement if infant continue not to latch at the breast.  ?Mom made aware of O/P services, breastfeeding support groups, community resources, and our phone # for post-discharge questions.   ?Maternal Data ?Has patient been taught Hand Expression?: Yes ? ?Feeding ?Mother's Current Feeding Choice: Breast Milk ? ?LATCH Score ?Latch: Too sleepy or reluctant, no latch achieved, no sucking elicited. ? ?Audible Swallowing: None ? ?Type of Nipple: Flat ? ?Comfort (Breast/Nipple): Soft / non-tender ? ?Hold (Positioning): Assistance needed to correctly position infant at breast and maintain latch. ? ?LATCH Score: 4 ? ? ?Lactation Tools Discussed/Used ?Tools: Pump;Shells ?Breast pump type:  Manual ?Pump Education: Setup, frequency, and cleaning;Milk Storage ?Reason for Pumping: pre-pump prior to latching infant at the breast. ?Pumping frequency: Mom will pre-pump breast prior to latching infant. ?Pumped volume: 2 mL ? ?Interventions ?Interventions: Breast feeding basics reviewed;Assisted with latch;Skin to skin;Hand express;Pre-pump if needed;Breast compression;Position options;Support pillows;Adjust position;Expressed milk;Shells;Education;Hand pump;LC Services brochure ? ?Discharge ?  ? ?Consult Status ?Consult Status: Follow-up ?Date: 11/18/21 ?Follow-up type: In-patient ? ? ? ?Danelle Earthly ?11/18/2021, 12:02 AM ? ? ? ?

## 2021-11-19 ENCOUNTER — Other Ambulatory Visit (HOSPITAL_COMMUNITY): Payer: Self-pay

## 2021-11-19 DIAGNOSIS — O26899 Other specified pregnancy related conditions, unspecified trimester: Secondary | ICD-10-CM

## 2021-11-19 MED ORDER — IBUPROFEN 600 MG PO TABS
600.0000 mg | ORAL_TABLET | Freq: Four times a day (QID) | ORAL | 0 refills | Status: DC | PRN
  Filled 2021-11-19: qty 60, 15d supply, fill #0

## 2021-11-19 MED ORDER — ACETAMINOPHEN 500 MG PO TABS
1000.0000 mg | ORAL_TABLET | Freq: Four times a day (QID) | ORAL | 0 refills | Status: DC | PRN
Start: 2021-11-19 — End: 2021-12-19
  Filled 2021-11-19: qty 60, 8d supply, fill #0

## 2021-11-19 NOTE — Lactation Note (Signed)
This note was copied from a baby's chart. ?Lactation Consultation Note ?Baby has no interest in BF still at this time. With a lot of stimulation I finally got baby to suckle a few times using NS. ?Gave baby 10 ml DBM for appetizer to give baby energy to get her to feed. It took a lot of stimulation to get baby to feed and take 10 ml. ?Baby doing a lot of gagging after feeding over. Little spit up noted. ?Encouraged mom to use DEBP for stimulation and if she can collect any colostrum maybe try to spoon feed baby if she will take it. ?Encouraged mom to wear shells in am w/bra. ?Placed on bedside table so not to forget. ?Baby wouldn't hardly suckle on LC gloved finger. Mainly bites. ?Mom stated baby is biting her. ?Baby may have tight frenulum.  ?Encouraged STS and try to feed again in 3 hrs or before if cueing. ?If baby hasn't sparked an interest in BF may need ST consult. ? ?Patient Name: Joy Dyer ?Today's Date: 11/19/2021 ?Reason for consult: Follow-up assessment;Primapara;Early term 37-38.6wks ?Age:83 hours ? ?Maternal Data ?  ? ?Feeding ?Mother's Current Feeding Choice: Breast Milk and Donor Milk ?Nipple Type: Slow - flow ? ?LATCH Score ?Latch: Too sleepy or reluctant, no latch achieved, no sucking elicited. ? ?Audible Swallowing: None ? ?Type of Nipple: Flat ? ?Comfort (Breast/Nipple): Filling, red/small blisters or bruises, mild/mod discomfort (sore) ? ?Hold (Positioning): Full assist, staff holds infant at breast ? ?LATCH Score: 2 ? ? ?Lactation Tools Discussed/Used ?Tools: Nipple [place] Fuel;Pump ?Nipple shield size: 20 ?Breast pump type: Double-Electric Breast Pump ?Pump Education: Setup, frequency, and cleaning ?Reason for Pumping: flat ?Pumping frequency: q3h ? ?Interventions ?Interventions: Breast feeding basics reviewed;Assisted with latch;Skin to skin;Breast massage;Breast compression;Adjust position;Support pillows;Position options;DEBP;Pace feeding ? ?Discharge ?  ? ?Consult Status ?Consult Status:  Follow-up ?Date: 11/19/21 ?Follow-up type: In-patient ? ? ? ?Theodoro Kalata ?11/19/2021, 2:15 AM ? ? ? ?

## 2021-11-19 NOTE — Lactation Note (Addendum)
This note was copied from a baby's chart. ?Lactation Consultation Note ? ?Patient Name: Joy Dyer ?Today's Date: 11/19/2021 ?Reason for consult: Follow-up assessment ?Age:20 hours ? ?P1, Mother was working on breastfeeding and supplementing with donor milk.  She will supplement with formula until her milk supply increases.  She tried pumping and it hurt and she stopped.  Assisted mother with comfortable pumping by increasing flange size for now to 27 and lubricating flanges with coconut oil.  Mother was able to tolerate DEBP.  Reviewed manual pump use until she get her DEBP from school  Suggest pumping q 3 hours.  Mother is noting transitional breastmilk. ?Reviewed engorgement care and monitoring voids/stools. ?Feed on demand with cues.  Goal 8-12+ times per day after first 24 hrs.  Place baby STS if not cueing.  ? ? ?Maternal Data ?Has patient been taught Hand Expression?: Yes ?Does the patient have breastfeeding experience prior to this delivery?: No ? ?Feeding ?Mother's Current Feeding Choice: Breast Milk and Donor Milk ?Nipple Type: Slow - flow ? ? ?Lactation Tools Discussed/Used ?Tools: Pump ?Breast pump type: Double-Electric Breast Pump ?Reason for Pumping: stimulation and supplementation ?Pumping frequency:  (Recommend q 3hours) ? ?Interventions ?Interventions: Coconut oil ? ?Discharge ?Discharge Education: Engorgement and breast care;Warning signs for feeding baby ?Pump: Manual;DEBP ? ?Consult Status ?Consult Status: Complete ?Date: 11/19/21 ? ? ? ?Dahlia Byes Boschen ?11/19/2021, 9:56 AM ? ? ? ?

## 2021-11-20 ENCOUNTER — Inpatient Hospital Stay (HOSPITAL_COMMUNITY)
Admission: AD | Admit: 2021-11-20 | Discharge: 2021-11-20 | Disposition: A | Discharge status: Home / Self Care | Payer: Medicaid Other | Attending: Family Medicine | Admitting: Family Medicine

## 2021-11-20 ENCOUNTER — Other Ambulatory Visit: Payer: Self-pay

## 2021-11-20 ENCOUNTER — Encounter (HOSPITAL_COMMUNITY): Payer: Self-pay | Admitting: Obstetrics & Gynecology

## 2021-11-20 ENCOUNTER — Encounter: Payer: Medicaid Other | Admitting: Obstetrics & Gynecology

## 2021-11-20 ENCOUNTER — Encounter (HOSPITAL_COMMUNITY): Payer: Self-pay | Admitting: Family Medicine

## 2021-11-20 DIAGNOSIS — R600 Localized edema: Secondary | ICD-10-CM | POA: Diagnosis not present

## 2021-11-20 DIAGNOSIS — O1205 Gestational edema, complicating the puerperium: Secondary | ICD-10-CM | POA: Diagnosis present

## 2021-11-20 NOTE — MAU Note (Signed)
Joy Dyer is a 21 y.o. at Unknown here in MAU reporting: intermittent chest discomfort that began this morning, states located in center of chest and feels like "tightness".  States discomfort doesn't last long.  Also reports bilateral swelling in legs.  Denies elevated BP's during pregnancy. ?S/P NVD 11/17/21 ?Onset of complaint: today ?Pain score: 4 ?Vitals:  ? 11/20/21 1429  ?BP: 139/84  ?Pulse: 60  ?Resp: 18  ?Temp: 98.7 ?F (37.1 ?C)  ?SpO2: 99%  ?   ? ?Lab orders placed from triage:    ?

## 2021-11-20 NOTE — MAU Provider Note (Signed)
?History  ?  ? ?CSN: [id number] ? ?Arrival date and time: 11/20/21 1404 ? ? Event Date/Time  ? First Provider Initiated Contact with Patient 11/20/21 1517   ?  ? ?Chief Complaint  ?Patient presents with  ? Swelling in Legs  ? Chest Discomfort  ? ?Joy Dyer is a 21 y.o. G1P1001 at 3 Days Postpartum who receives care at [facility]  She presents today for Swelling in Legs.  She states that she has has been experiencing pain in her feet with ambulation.  However, she states the pain is not sharp and is only in the feet.  She states the pain resolves with rest.  Patient expresses concern with the increased swelling.  She denies issues with HA, visual disturbances, dizziness, or issues with urination, constipation, or diarrhea.  She reports some lightheadedness yesterday,but contributes to lack of sleep.  ? ? ?OB History   ? ? Gravida  ?1  ? Para  ?1  ? Term  ?1  ? Preterm  ?   ? AB  ?   ? Living  ?1  ?  ? ? SAB  ?   ? IAB  ?   ? Ectopic  ?   ? Multiple  ?0  ? Live Births  ?1  ?   ?  ?  ? ? ?Past Medical History:  ?Diagnosis Date  ? Anxiety   ? Exacerbated by pregnancy; also excessive by grandfather in ICU.  ? Headache   ? ? ?Past Surgical History:  ?Procedure Laterality Date  ? None    ? ? ?Family History  ?Problem Relation Age of Onset  ? Aneurysm Mother   ? Hypertension Father   ? Diabetes Father   ? Healthy Sister   ? Healthy Brother   ? Aneurysm Maternal Grandmother   ? Stroke Maternal Grandfather   ? Cancer Maternal Grandfather   ? Healthy Paternal Grandmother   ? Healthy Maternal Aunt   ? ? ?Social History  ? ?Tobacco Use  ? Smoking status: Never  ? Smokeless tobacco: Never  ?Vaping Use  ? Vaping Use: Former  ?Substance Use Topics  ? Alcohol use: Not Currently  ? Drug use: Never  ? ? ?Allergies: No Known Allergies ? ?Medications Prior to Admission  ?Medication Sig Dispense Refill Last Dose  ? acetaminophen (TYLENOL) 500 MG tablet Take 2 tablets (1,000 mg total) by mouth every 6 (six) hours as needed. 60  tablet 0 11/20/2021  ? docusate sodium (COLACE) 250 MG capsule Take 250 mg by mouth daily.   Past Month  ? ferrous sulfate (FERROUSUL) 325 (65 FE) MG tablet Take 1 tablet (325 mg total) by mouth every other day. 35 tablet 0 11/20/2021  ? ibuprofen (ADVIL) 600 MG tablet Take 1 tablet (600 mg total) by mouth every 6 (six) hours as needed. 60 tablet 0 11/20/2021  ? pantoprazole (PROTONIX) 20 MG tablet Take 1 tablet (20 mg total) by mouth daily. 30 tablet 3 Past Month  ? Prenatal Vit-Fe Fumarate-FA (PRENATAL MULTIVITAMIN) TABS tablet Take 1 tablet by mouth daily at 12 noon.   11/20/2021  ? ? ?Review of Systems  ?Eyes:  Negative for visual disturbance.  ?Respiratory:  Negative for shortness of breath.   ?Cardiovascular:  Negative for chest pain.  ?Gastrointestinal:  Negative for abdominal pain, diarrhea, nausea and vomiting.  ?Genitourinary:  Positive for vaginal bleeding. Negative for difficulty urinating and vaginal discharge.  ?Neurological:  Positive for light-headedness (Yesterday). Negative for dizziness and headaches.  ?Physical  Exam  ? ?Blood pressure 128/79, pulse 63, temperature 98.7 ?F (37.1 ?C), temperature source Oral, resp. rate 18, height 5' (1.524 m), weight 64.2 kg, SpO2 99 %, unknown if currently breastfeeding. ? ?Physical Exam ?Vitals reviewed.  ?Constitutional:   ?   Appearance: Normal appearance.  ?HENT:  ?   Head: Normocephalic and atraumatic.  ?Eyes:  ?   Conjunctiva/sclera: Conjunctivae normal.  ?Cardiovascular:  ?   Rate and Rhythm: Normal rate.  ?Pulmonary:  ?   Effort: Pulmonary effort is normal. No respiratory distress.  ?   Breath sounds: Normal breath sounds.  ?Abdominal:  ?   General: Bowel sounds are normal.  ?   Palpations: Abdomen is soft.  ?   Tenderness: There is no abdominal tenderness.  ?Musculoskeletal:     ?   General: Normal range of motion.  ?   Cervical back: Normal range of motion.  ?   Right lower leg: 1+ Pitting Edema present.  ?   Left lower leg: 1+ Pitting Edema present.  ?   Right  ankle: Swelling present.  ?   Left ankle: Swelling present.  ?   Right foot: Swelling present.  ?   Left foot: Swelling present.  ?   Comments: No abnormal warmth, bruising, or notable varicosities.   ?Feet:  ?   Right foot:  ?   Skin integrity: Skin integrity normal.  ?   Left foot:  ?   Skin integrity: Skin integrity normal.  ?Skin: ?   General: Skin is warm and dry.  ?Neurological:  ?   Mental Status: She is alert and oriented to person, place, and time.  ?Psychiatric:     ?   Mood and Affect: Mood normal.     ?   Behavior: Behavior normal.  ? ? ?MAU Course  ?Procedures ?No results found for this or any previous visit (from the past 24 hour(s)). ? ?MDM ?Exam ?Education ?Reassurance ?Assessment and Plan  ?21 year old ?S/P SVD x 3 Days ?Postpartum State ?Pedal Edema ? ?-Exam performed. ?-Reassured that normal to have increased swelling after delivery d/t fluid shifts. ?-Discussed management including elevation, proper hydration, and rest. ?-Reviewed r/b of Diuretics and discussed usage for removal of extra fluid. ?-Patient declines and reports reassurance with knowing swelling is normal. ?-Precautions given. ?-Encouraged to call primary office or return to MAU if symptoms worsen or with the onset of new symptoms. ?-Discharged to home in stable condition. ? ? ?Cherre Robins ?11/20/2021, 3:17 PM  ?

## 2021-11-25 ENCOUNTER — Inpatient Hospital Stay (HOSPITAL_COMMUNITY)
Admission: AD | Admit: 2021-11-25 | Discharge: 2021-11-26 | Disposition: A | Discharge status: Home / Self Care | Payer: Medicaid Other | Attending: Obstetrics & Gynecology | Admitting: Obstetrics & Gynecology

## 2021-11-25 ENCOUNTER — Other Ambulatory Visit: Payer: Self-pay

## 2021-11-25 ENCOUNTER — Encounter (HOSPITAL_COMMUNITY): Payer: Self-pay | Admitting: Obstetrics & Gynecology

## 2021-11-25 DIAGNOSIS — G44209 Tension-type headache, unspecified, not intractable: Secondary | ICD-10-CM | POA: Diagnosis not present

## 2021-11-25 DIAGNOSIS — O165 Unspecified maternal hypertension, complicating the puerperium: Secondary | ICD-10-CM | POA: Diagnosis not present

## 2021-11-25 DIAGNOSIS — O9089 Other complications of the puerperium, not elsewhere classified: Secondary | ICD-10-CM | POA: Diagnosis present

## 2021-11-25 MED ORDER — ASPIRIN-ACETAMINOPHEN-CAFFEINE 250-250-65 MG PO TABS
2.0000 | ORAL_TABLET | Freq: Once | ORAL | Status: AC
  Administered 2021-11-26: 2 via ORAL
  Filled 2021-11-25: qty 2

## 2021-11-25 NOTE — MAU Note (Signed)
.  Joy Dyer is a 21 y.o. at Unknown here in MAU reporting: PP vag delivery 11/17/2021. Reports she had had a H/A x3 days has taken Ibuprofen without much relief. Took Excedrin last night was able to sleep but headache came back today.  ? Pt has taken b/p at home and I was elevated  162/108 , 131/84 last night and today was 138[facility] ([facility])  ?148/102 on home cuff tonight. Headache is dull and constant. Denies any visual changes. Eports she had a sharp pain in upper abd last night that did not last long nothing today.  ? ? ?Onset of complaint: 3 days ?Pain score: 4 ?There were no vitals filed for this visit.   ?FHT:n/a ?Lab orders placed from triage:   ? ?

## 2021-11-25 NOTE — MAU Provider Note (Signed)
?History  ?  ? ?[id number] ? ?Arrival date and time: 11/25/21 2221 ?  ? ?Chief Complaint  ?Patient presents with  ? Headache  ? ? ? ?HPI ?Joy Dyer is a 21 y.o. at 1 week post partum with PMHx notable for anxiety & headaches, who presents for hypertension & headache. ?Had SVD at 4642w6d after being induced for questionable abruption. No issues with hypertension during pregnancy nor in the postpartum period. Denies hypertension outside of pregnancy.  ?Reports headache for the last 3 days. Occipital headache that feels comparable to previous headaches. Took exedrin last night with good relief but woke up with "annoying" headache this morning. No visual disturbance or epigastric pain.  ?Took BP at home with wrist cuff & at [facility] - elevated both times.  ?  ?OB History   ? ? Gravida  ?1  ? Para  ?1  ? Term  ?1  ? Preterm  ?   ? AB  ?   ? Living  ?1  ?  ? ? SAB  ?   ? IAB  ?   ? Ectopic  ?   ? Multiple  ?0  ? Live Births  ?1  ?   ?  ?  ? ? ?Past Medical History:  ?Diagnosis Date  ? Anxiety   ? Exacerbated by pregnancy; also excessive by grandfather in ICU.  ? Headache   ? ? ?Past Surgical History:  ?Procedure Laterality Date  ? None    ? ? ?Family History  ?Problem Relation Age of Onset  ? Aneurysm Mother   ? Hypertension Father   ? Diabetes Father   ? Healthy Sister   ? Healthy Brother   ? Aneurysm Maternal Grandmother   ? Stroke Maternal Grandfather   ? Cancer Maternal Grandfather   ? Healthy Paternal Grandmother   ? Healthy Maternal Aunt   ? ? ?No Known Allergies ? ?No current facility-administered medications on file prior to encounter.  ? ?Current Outpatient Medications on File Prior to Encounter  ?Medication Sig Dispense Refill  ? docusate sodium (COLACE) 250 MG capsule Take 250 mg by mouth daily.    ? ferrous sulfate (FERROUSUL) 325 (65 FE) MG tablet Take 1 tablet (325 mg total) by mouth every other day. 35 tablet 0  ? ibuprofen (ADVIL) 600 MG tablet Take 1 tablet (600 mg total) by mouth every 6 (six) hours as  needed. 60 tablet 0  ? pantoprazole (PROTONIX) 20 MG tablet Take 1 tablet (20 mg total) by mouth daily. 30 tablet 3  ? Prenatal Vit-Fe Fumarate-FA (PRENATAL MULTIVITAMIN) TABS tablet Take 1 tablet by mouth daily at 12 noon.    ? acetaminophen (TYLENOL) 500 MG tablet Take 2 tablets (1,000 mg total) by mouth every 6 (six) hours as needed. 60 tablet 0  ? ? ? ?ROS ?Pertinent positives and negative per HPI, all others reviewed and negative ? ?Physical Exam  ? ?BP (!) 145/97   Pulse (!) 50   Temp 98.8 ?F (37.1 ?C)   Resp 18   Ht 5' (1.524 m)   Wt 55.6 kg   Breastfeeding Unknown   BMI 23.92 kg/m?  ? ?Patient Vitals for the past 24 hrs: ? BP Temp Pulse Resp Height Weight  ?11/26/21 0031 (!) 145/97 -- (!) 50 -- -- --  ?11/26/21 0016 (!) 138/99 -- 63 -- -- --  ?11/26/21 0001 (!) 139/94 -- 70 -- -- --  ?11/25/21 2346 129/87 -- 68 -- -- --  ?11/25/21 2331 (!) 140/91 --  68 -- -- --  ?11/25/21 2318 135/89 -- (!) 58 -- -- --  ?11/25/21 2306 (!) 136/94 -- 67 -- -- --  ?11/25/21 2247 (!) 138/95 98.8 ?F (37.1 ?C) 78 18 5' (1.524 m) 55.6 kg  ? ? ?Physical Exam ?Vitals and nursing note reviewed.  ?Constitutional:   ?   General: She is not in acute distress. ?   Appearance: She is well-developed.  ?HENT:  ?   Head: Normocephalic and atraumatic.  ?Cardiovascular:  ?   Rate and Rhythm: Normal rate and regular rhythm.  ?   Heart sounds: Normal heart sounds.  ?Pulmonary:  ?   Effort: Pulmonary effort is normal. No respiratory distress.  ?   Breath sounds: Normal breath sounds.  ?Musculoskeletal:  ?   Right lower leg: No edema.  ?   Left lower leg: No edema.  ?Skin: ?   General: Skin is warm and dry.  ?Neurological:  ?   Mental Status: She is alert.  ?   Deep Tendon Reflexes:  ?   Reflex Scores: ?     Patellar reflexes are 2+ on the right side. ?   Comments: No clonus  ?  ? ? ? ?Labs ?Results for orders placed or performed during the hospital encounter of 11/25/21 (from the past 24 hour(s))  ?CBC     Status: Abnormal  ? Collection  Time: 11/25/21 11:54 PM  ?Result Value Ref Range  ? WBC 10.2 4.0 - 10.5 K/uL  ? RBC 3.90 3.87 - 5.11 MIL/uL  ? Hemoglobin 10.7 (L) 12.0 - 15.0 g/dL  ? HCT 33.7 (L) 36.0 - 46.0 %  ? MCV 86.4 80.0 - 100.0 fL  ? MCH 27.4 26.0 - 34.0 pg  ? MCHC 31.8 30.0 - 36.0 g/dL  ? RDW 14.2 11.5 - 15.5 %  ? Platelets 631 (H) 150 - 400 K/uL  ? nRBC 0.0 0.0 - 0.2 %  ?Comprehensive metabolic panel     Status: Abnormal  ? Collection Time: 11/25/21 11:54 PM  ?Result Value Ref Range  ? Sodium 137 135 - 145 mmol/L  ? Potassium 3.8 3.5 - 5.1 mmol/L  ? Chloride 108 98 - 111 mmol/L  ? CO2 22 22 - 32 mmol/L  ? Glucose, Bld 88 70 - 99 mg/dL  ? BUN 14 6 - 20 mg/dL  ? Creatinine, Ser 0.85 0.44 - 1.00 mg/dL  ? Calcium 8.7 (L) 8.9 - 10.3 mg/dL  ? Total Protein 6.6 6.5 - 8.1 g/dL  ? Albumin 3.0 (L) 3.5 - 5.0 g/dL  ? AST 22 15 - 41 U/L  ? ALT 25 0 - 44 U/L  ? Alkaline Phosphatase 79 38 - 126 U/L  ? Total Bilirubin 0.5 0.3 - 1.2 mg/dL  ? GFR, Estimated >60 >60 mL/min  ? Anion gap 7 5 - 15  ? ? ?Imaging ?No results found. ? ?MAU Course  ?Procedures ?Lab Orders    ?     CBC    ?     Comprehensive metabolic panel    ?Meds ordered this encounter  ?Medications  ? aspirin-acetaminophen-caffeine (EXCEDRIN MIGRAINE) per tablet 2 tablet  ? NIFEdipine (PROCARDIA-XL/NIFEDICAL-XL) 30 MG 24 hr tablet  ?  Sig: Take 1 tablet (30 mg total) by mouth daily.  ?  Dispense:  30 tablet  ?  Refill:  0  ?  Order Specific Question:   Supervising Provider  ?  Answer:   Jaynie Collins A [[id number]]  ? ?Imaging Orders  ?No imaging studies  ordered today  ? ? ?MDM ?Postpartum patient with new onset hypertension & headache. ?Mild headache consistent with previous headaches likely related to sleep changes & stress with newborn. Given Excedrin while in MAU with complete resolution of headache.  ?BPs elevated. No severe range readings. Preeclampsia labs normals. Will prescribe procardia & have BP check in the office later this week.  ? ?Assessment and Plan  ? ?1. Postpartum hypertension   ?-Rx procardia xl 30 mg daily. Reviewed s/s preeclampsia. Message to [facility] for BP check appointment later this week  ?2. Acute non intractable tension-type headache  ?-Reviewed treatment of headaches at home & concerning s/s  ? ? ? ?Judeth Horn, NP ?11/26/21 ?12:56 AM ? ? ?

## 2021-11-26 DIAGNOSIS — O165 Unspecified maternal hypertension, complicating the puerperium: Secondary | ICD-10-CM

## 2021-11-26 LAB — COMPREHENSIVE METABOLIC PANEL
ALT: 25 U/L (ref 0–44)
AST: 22 U/L (ref 15–41)
Albumin: 3 g/dL — ABNORMAL LOW (ref 3.5–5.0)
Alkaline Phosphatase: 79 U/L (ref 38–126)
Anion gap: 7 (ref 5–15)
BUN: 14 mg/dL (ref 6–20)
CO2: 22 mmol/L (ref 22–32)
Calcium: 8.7 mg/dL — ABNORMAL LOW (ref 8.9–10.3)
Chloride: 108 mmol/L (ref 98–111)
Creatinine, Ser: 0.85 mg/dL (ref 0.44–1.00)
GFR, Estimated: >60 mL/min (ref >60)
Glucose, Bld: 88 mg/dL (ref 70–99)
Potassium: 3.8 mmol/L (ref 3.5–5.1)
Sodium: 137 mmol/L (ref 135–145)
Total Bilirubin: 0.5 mg/dL (ref 0.3–1.2)
Total Protein: 6.6 g/dL (ref 6.5–8.1)

## 2021-11-26 LAB — CBC
HCT: 33.7 % — ABNORMAL LOW (ref 36.0–46.0)
Hemoglobin: 10.7 g/dL — ABNORMAL LOW (ref 12.0–15.0)
MCH: 27.4 pg (ref 26.0–34.0)
MCHC: 31.8 g/dL (ref 30.0–36.0)
MCV: 86.4 fL (ref 80.0–100.0)
Platelets: 631 10*3/uL — ABNORMAL HIGH (ref 150–400)
RBC: 3.9 MIL/uL (ref 3.87–5.11)
RDW: 14.2 % (ref 11.5–15.5)
WBC: 10.2 10*3/uL (ref 4.0–10.5)
nRBC: 0 % (ref 0.0–0.2)

## 2021-11-26 MED ORDER — NIFEDIPINE ER OSMOTIC RELEASE 30 MG PO TB24: 30.0000 mg | ORAL_TABLET | Freq: Every day | ORAL | 0 refills | Status: DC

## 2021-11-27 ENCOUNTER — Telehealth (HOSPITAL_COMMUNITY): Payer: Self-pay | Admitting: *Deleted

## 2021-11-27 ENCOUNTER — Encounter: Payer: Medicaid Other | Admitting: Obstetrics & Gynecology

## 2021-11-27 DIAGNOSIS — O1003 Pre-existing essential hypertension complicating the puerperium: Secondary | ICD-10-CM | POA: Diagnosis not present

## 2021-11-27 NOTE — Telephone Encounter (Signed)
Patient reported that she saw MD today. Stated, "My blood pressure was 131/78, and I'm taking my blood pressure medication that they gave me." RN reviewed signs of preeclampsia with patient - verbalized understanding. Patient voiced no other questions or concerns at this time. EPDS=2. ?Patient asked about infant's umbilical cord - RN reviewed signs of infection to report to pediatrician. Patient verbalized understanding. Instructed patient not to bathe infant in a tub until umbilical cord site has completely healed - voiced understanding.  Patient voiced no other questions or concerns regarding infant at this time. Patient reports infant sleeps in a bassinet on her back. RN reviewed ABCs of safe sleep. Patient verbalized understanding. Patient requested RN email information on hospital's virtual postpartum classes and support groups. Email sent. Erline Levine, RN, 11/27/21, 2008  ?

## 2021-11-29 ENCOUNTER — Ambulatory Visit (INDEPENDENT_AMBULATORY_CARE_PROVIDER_SITE_OTHER): Payer: Medicaid Other | Admitting: *Deleted

## 2021-11-29 DIAGNOSIS — O165 Unspecified maternal hypertension, complicating the puerperium: Secondary | ICD-10-CM

## 2021-11-29 NOTE — Progress Notes (Cosign Needed)
Subjective:  ?Joy Dyer is a 21 y.o. female here for BP check.  ? ?Hypertension ROS: Patient denies any headaches, visual symptoms, RUQ/epigastric pain or other concerning symptoms. ? ?Objective:  ?BP 116/77   Pulse 71   ?Appearance alert, well appearing, and in no distress. ?General exam BP noted to be well controlled today in office.  ? ? ?Assessment:   ?Blood Pressure well controlled.  ? ?Plan:  ?Follow up at postpartum visit and or as needed ? ? ?Scheryl Marten, RN .  ?

## 2021-12-02 NOTE — Progress Notes (Signed)
Patient was assessed and managed by nursing staff during this encounter. I have reviewed the chart and agree with the documentation and plan. I have also made any necessary editorial changes. ? ?[place] Bing, MD ?12/02/2021 9:33 AM  ? ?

## 2021-12-04 ENCOUNTER — Encounter: Payer: Medicaid Other | Admitting: Obstetrics & Gynecology

## 2021-12-06 ENCOUNTER — Other Ambulatory Visit: Payer: Self-pay | Admitting: Obstetrics and Gynecology

## 2021-12-17 ENCOUNTER — Encounter: Payer: Self-pay | Admitting: Obstetrics & Gynecology

## 2021-12-17 ENCOUNTER — Telehealth: Payer: Self-pay

## 2021-12-17 NOTE — Telephone Encounter (Signed)
Return call to pt regarding vm  ?No answer no vm  ?Called again still not able to reach pt  ?

## 2021-12-17 NOTE — Telephone Encounter (Signed)
Pt called regarding taking blood pressure Rx late yesterday in the evening , then today Rx left in boyfriends car who is away at this time 1 hr away ?Pt asked could rx be taken at a different time.  ?Pt denies any sx's no HA's, no swelling no visual changes. ?Has not checked B/P today. ?Pt advised can start taking Rx in evening or miss today and tomorrow take in am at 8am like she usually does. However I advised pt to let us know if any sx's or B/P is elevated.  ?Pt has appt this week in office for pp.  ? ?Pt agreeable and voiced understanding.  ? ? ?

## 2021-12-19 ENCOUNTER — Ambulatory Visit (INDEPENDENT_AMBULATORY_CARE_PROVIDER_SITE_OTHER): Payer: Medicaid Other | Admitting: Family Medicine

## 2021-12-19 ENCOUNTER — Encounter: Payer: Self-pay | Admitting: Family Medicine

## 2021-12-19 ENCOUNTER — Other Ambulatory Visit (HOSPITAL_COMMUNITY)
Admission: RE | Admit: 2021-12-19 | Discharge: 2021-12-19 | Disposition: A | Discharge status: Home / Self Care | Payer: Medicaid Other | Source: Ambulatory Visit | Attending: Family Medicine | Admitting: Family Medicine

## 2021-12-19 VITALS — BP 119/77 | HR 86 | Wt 114.0 lb

## 2021-12-19 DIAGNOSIS — N760 Acute vaginitis: Secondary | ICD-10-CM | POA: Insufficient documentation

## 2021-12-19 DIAGNOSIS — F53 Postpartum depression: Secondary | ICD-10-CM | POA: Diagnosis not present

## 2021-12-19 DIAGNOSIS — N898 Other specified noninflammatory disorders of vagina: Secondary | ICD-10-CM

## 2021-12-19 DIAGNOSIS — N912 Amenorrhea, unspecified: Secondary | ICD-10-CM | POA: Diagnosis not present

## 2021-12-19 LAB — POCT URINE PREGNANCY: Preg Test, Ur: NEGATIVE

## 2021-12-19 NOTE — Progress Notes (Signed)
? ? ?Post Partum Visit Note ? ?Joy Dyer is a 21 y.o. G69P1001 female who presents for a postpartum visit. She is 4 weeks postpartum following a normal spontaneous vaginal delivery.  I have fully reviewed the prenatal and intrapartum course. The delivery was at 37 gestational weeks.  Anesthesia: epidural. Postpartum course has been . Baby is doing well. Baby is feeding by bottle - Similac Advance. Bleeding no bleeding. Bowel function is normal. Bladder function is normal. Patient is sexually active. Unprotected. She would like NFP. Contraception method is none. Postpartum depression screening: negative. ?Reports vaginal odor and wants wet prep. ? ? ?The pregnancy intention screening data noted above was reviewed. Potential methods of contraception were discussed. The patient elected to proceed with No data recorded. ? ? Edinburgh Postnatal Depression Scale - 12/19/21 1429   ? ?  ? Edinburgh Postnatal Depression Scale:  In the Past 7 Days  ? I have been able to laugh and see the funny side of things. 0   ? I have looked forward with enjoyment to things. 1   ? I have blamed myself unnecessarily when things went wrong. 0   ? I have been anxious or worried for no good reason. 1   ? I have felt scared or panicky for no good reason. 0   ? Things have been getting on top of me. 0   ? I have been so unhappy that I have had difficulty sleeping. 1   ? I have felt sad or miserable. 2   ? I have been so unhappy that I have been crying. 2   ? The thought of harming myself has occurred to me. 0   ? Edinburgh Postnatal Depression Scale Total 7   ? ?  ?  ? ?  ? ? ?Health Maintenance Due  ?Topic Date Due  ? HPV VACCINES (1 - 2-dose series) Never done  ? COVID-19 Vaccine (3 - Booster for Pfizer series) 11/15/2020  ? ? ?The following portions of the patient's history were reviewed and updated as appropriate: allergies, current medications, past family history, past medical history, past social history, past surgical history, and  problem list. ? ?Review of Systems ?Pertinent items are noted in HPI. ? ?Objective:  ?BP 119/77   Pulse 86   Wt 114 lb (51.7 kg)   Breastfeeding No   BMI 22.26 kg/m?   ? ?General:  alert, cooperative, and appears stated age  ?Lungs: Normal effort  ?Heart:  regular rate and rhythm  ?Abdomen: soft, non-tender; bowel sounds normal; no masses,  no organomegaly   ?   UPT negative  ?Assessment:  ? ?Normal postpartum exam.  ?Has had unprotected intercourse and wants UPT  ? ?Plan:  ? ?Essential components of care per ACOG recommendations: ? ?1.  Mood and well being: Patient with equivocal depression screening today. Reviewed local resources for support. Declined medication management, will refer to [facility]. ?- Patient tobacco use? No.   ?- hx of drug use? No.   ? ?2. Infant care and feeding:  ?-Patient currently breastmilk feeding? No.  ?-Social determinants of health (SDOH) reviewed in EPIC. No concerns ? ?3. Sexuality, contraception and birth spacing ?- Patient does not want a pregnancy in the next year.  Desired family size is unknown children.  ?- Reviewed reproductive life planning. Reviewed contraceptive methods based on pt preferences and effectiveness.  Patient desired No Method - Other Reason today.   ?- Discussed birth spacing of 18 months ? ?4. Sleep and  fatigue ?-Encouraged family/partner/community support of 4 hrs of uninterrupted sleep to help with mood and fatigue ? ?5. Physical Recovery  ?- Discussed patients delivery and complications. She describes her labor as mixed. ?- Patient had a Vaginal, no problems at delivery. Patient had a 1st degree laceration. Perineal healing reviewed. Patient expressed understanding ?- Patient has urinary incontinence? No. ?- Patient is safe to resume physical and sexual activity ? ?6.  Health Maintenance ?- HM due items addressed Yes ?- Last pap smear No results found for: DIAGPAP Pap smear not done at today's visit. Age ?-Breast Cancer screening indicated? No.  ? ?7. Chronic  Disease/Pregnancy Condition follow up: None ?  ?8. Vaginal discharge--check wet prep.  ? ?[name], MD ?[facility] for [facility]  ?

## 2021-12-20 ENCOUNTER — Ambulatory Visit (INDEPENDENT_AMBULATORY_CARE_PROVIDER_SITE_OTHER): Payer: Medicaid Other | Admitting: Licensed Clinical Social Worker

## 2021-12-20 DIAGNOSIS — F53 Postpartum depression: Secondary | ICD-10-CM

## 2021-12-20 NOTE — BH Specialist Note (Signed)
Integrated Behavioral Health via Telemedicine Visit ? ?12/20/2021 ?Colbey Mcbrien ?[id number] ? ?Number of [facility] Clinician visits: 1 ?Session Start time: 1:10pm  ?Session End time: 1:32pm ?Total time in minutes: 22 mins via mychart video  ? ?Referring Provider: Dr. Kennon Rounds  ?Patient/Family location: Home  ?[facility] Provider location: [facility]  ?All persons participating in visit: Pt T. Zapf and LCSW A. Linton Rump ?Types of Service: Individual psychotherapy and Video visit ? ?I connected with Hazle Nordmann and/or Valene Bors n/a via  Telephone or Video Enabled Telemedicine Application  (Video is Caregility application) and verified that I am speaking with the correct person using two identifiers. Discussed confidentiality: Yes  ? ?I discussed the limitations of telemedicine and the availability of in person appointments.  Discussed there is a possibility of technology failure and discussed alternative modes of communication if that failure occurs. ? ?I discussed that engaging in this telemedicine visit, they consent to the provision of behavioral healthcare and the services will be billed under their insurance. ? ?Patient and/or legal guardian expressed understanding and consented to Telemedicine visit: Yes  ? ?Presenting Concerns: ?Patient and/or family reports the following symptoms/concerns: depressed mood, difficulty sleeping, social isolation, limited family and social support, relationship conflict, and feeling overwhelm.  ?Duration of problem: approx 6 months; Severity of problem: mild ? ?Patient and/or Family's Strengths/Protective Factors: ?Concrete supports in place (healthy food, safe environments, etc.) ? ?Goals Addressed: ?Patient will: ? Reduce symptoms of: depression  ? Increase knowledge and/or ability of: coping skills  ? Demonstrate ability to: Increase healthy adjustment to current life circumstances and Increase adequate support systems for patient/family ? ?Progress towards  Goals: ?Ongoing ? ?Interventions: ?Interventions utilized:  Motivational Interviewing and Mindfulness or Relaxation Training ?Standardized Assessments completed: Edinburgh Postnatal Depression ? ?Patient and/or Family Response: Ms. Bujak responded to mychart visit  ? ?Assessment: ?Patient currently experiencing postpartum depression.  ? ?Patient may benefit from integrated behavioral health. ? ?Plan: ?Follow up with behavioral health clinician on : 01/03/2022 ?Behavioral recommendations: Prioritize rest, reduce social isolation (visit a local park with newborn, join a local mom group such as [employer]ocha mom), communicate needs with trusted friend and/or family for added support. ?Referral(s): [place] (In Clinic) ? ?I discussed the assessment and treatment plan with the patient and/or parent/guardian. They were provided an opportunity to ask questions and all were answered. They agreed with the plan and demonstrated an understanding of the instructions. ?  ?They were advised to call back or seek an in-person evaluation if the symptoms worsen or if the condition fails to improve as anticipated. ? ?Lynnea Ferrier, LCSW ?

## 2021-12-21 LAB — CERVICOVAGINAL ANCILLARY ONLY
Bacterial Vaginitis (gardnerella): POSITIVE — AB
Candida Glabrata: NEGATIVE
Candida Vaginitis: NEGATIVE
Chlamydia: NEGATIVE
Comment: NEGATIVE
Comment: NEGATIVE
Comment: NEGATIVE
Comment: NEGATIVE
Comment: NEGATIVE
Comment: NORMAL
Neisseria Gonorrhea: NEGATIVE
Trichomonas: NEGATIVE

## 2021-12-22 MED ORDER — METRONIDAZOLE 500 MG PO TABS: 500.0000 mg | ORAL_TABLET | Freq: Two times a day (BID) | ORAL | 0 refills | Status: DC

## 2021-12-22 NOTE — Addendum Note (Signed)
Addended by: [name] on: 12/22/2021 01:17 PM ? ? Modules accepted: Orders ? ?

## 2022-01-03 ENCOUNTER — Encounter: Payer: Medicaid Other | Admitting: Licensed Clinical Social Worker

## 2022-01-05 ENCOUNTER — Other Ambulatory Visit: Payer: Self-pay

## 2022-01-05 ENCOUNTER — Emergency Department (HOSPITAL_COMMUNITY)
Admission: EM | Admit: 2022-01-05 | Discharge: 2022-01-05 | Disposition: A | Discharge status: Home / Self Care | Payer: Medicaid Other | Attending: Emergency Medicine | Admitting: Emergency Medicine

## 2022-01-05 ENCOUNTER — Inpatient Hospital Stay (HOSPITAL_COMMUNITY)
Admission: AD | Admit: 2022-01-05 | Discharge: 2022-01-05 | Disposition: A | Discharge status: Home / Self Care | Payer: Medicaid Other | Attending: Obstetrics & Gynecology | Admitting: Obstetrics & Gynecology

## 2022-01-05 DIAGNOSIS — Z711 Person with feared health complaint in whom no diagnosis is made: Secondary | ICD-10-CM | POA: Diagnosis not present

## 2022-01-05 DIAGNOSIS — R030 Elevated blood-pressure reading, without diagnosis of hypertension: Secondary | ICD-10-CM | POA: Diagnosis not present

## 2022-01-05 DIAGNOSIS — I10 Essential (primary) hypertension: Secondary | ICD-10-CM | POA: Diagnosis present

## 2022-01-05 NOTE — ED Provider Notes (Signed)
[facility] EMERGENCY DEPARTMENT Provider Note   CSN: [id number] Arrival date & time: 01/05/22  1727    History  Chief Complaint  Patient presents with   Hypertension    Joy Dyer is a 21 y.o. female 7 weeks postpartum here for evaluation of elevated blood pressure.  States she initially had elevated blood pressures postpartum.  Was started on Procardia. Stopped 2 weeks ago. Has had intermittent elevated BP at home. HA yesterday none today.  Denies "sudden onset" headache.  No neck pain, numbness, weakness, vision changes, neck rigidity, chest pain, shortness of breath, abdominal pain, emesis.  Does have family history of brain aneurysm.  Denies ever being assessed for this previously.  She was seen at [facility]U earlier today medically cleared.  She remained normotensive during the MAU visit.  Low suspicion for eclampsia at that time. Patient had formal ED assessment subsequently, here after being discharged from [facility]U. She denies any current symptoms.  HPI     Home Medications Prior to Admission medications   Medication Sig Start Date End Date Taking? Authorizing Provider  ferrous sulfate (FERROUSUL) 325 (65 FE) MG tablet Take 1 tablet (325 mg total) by mouth every other day. 09/26/21 12/05/21  Olds Bing, MD  metroNIDAZOLE (FLAGYL) 500 MG tablet Take 1 tablet (500 mg total) by mouth 2 (two) times daily. 12/22/21   [name], MD  NIFEdipine (PROCARDIA-XL/NIFEDICAL-XL) 30 MG 24 hr tablet Take 1 tablet (30 mg total) by mouth daily. 11/26/21 12/26/21  Judeth Horn, NP      Allergies    Patient has no known allergies.    Review of Systems   Review of Systems  Constitutional: Negative.   HENT: Negative.    Respiratory: Negative.    Cardiovascular: Negative.   Gastrointestinal: Negative.   Genitourinary: Negative.   Musculoskeletal: Negative.   Skin: Negative.   Neurological:  Positive for headaches (resolved). Negative for dizziness, tremors, seizures, syncope,  facial asymmetry, speech difficulty, weakness, light-headedness and numbness.  All other systems reviewed and are negative.  Physical Exam Updated Vital Signs BP 113/72   Pulse 71   Temp 98.3 F (36.8 C)   Resp 17   SpO2 99%  Physical Exam Physical Exam  Constitutional: Pt is oriented to person, place, and time. Pt appears well-developed and well-nourished. No distress.  HENT:  Head: Normocephalic and atraumatic.  Mouth/Throat: Oropharynx is clear and moist.  Eyes: Conjunctivae and EOM are normal. Pupils are equal, round, and reactive to light. No scleral icterus.  No horizontal, vertical or rotational nystagmus  Neck: Normal range of motion. Neck supple.  Full active and passive ROM without pain No midline or paraspinal tenderness No nuchal rigidity or meningeal signs  Cardiovascular: Normal rate, regular rhythm and intact distal pulses.   Pulmonary/Chest: Effort normal and breath sounds normal. No respiratory distress. Pt has no wheezes. No rales.  Abdominal: Soft. Bowel sounds are normal. There is no tenderness. There is no rebound and no guarding.  Musculoskeletal: Normal range of motion.  Lymphadenopathy:    No cervical adenopathy.  Neurological: Pt. is alert and oriented to person, place, and time. He has normal reflexes. No cranial nerve deficit.  Exhibits normal muscle tone. Coordination normal.  Mental Status:  Alert, oriented, thought content appropriate. Speech fluent without evidence of aphasia. Able to follow 2 step commands without difficulty.  Cranial Nerves:  II:  Peripheral visual fields grossly normal, pupils equal, round, reactive to light III,IV, VI: ptosis not present, extra-ocular motions intact  bilaterally  V,VII: smile symmetric, facial light touch sensation equal VIII: hearing grossly normal bilaterally  IX,X: midline uvula rise  XI: bilateral shoulder shrug equal and strong XII: midline tongue extension  Motor:  5/5 in upper and lower extremities  bilaterally including strong and equal grip strength and dorsiflexion/plantar flexion Sensory: Pinprick and light touch normal in all extremities.  Deep Tendon Reflexes: 2+ and symmetric  Cerebellar: normal finger-to-nose with bilateral upper extremities Gait: normal gait and balance CV: distal pulses palpable throughout   Skin: Skin is warm and dry. No rash noted. Pt is not diaphoretic.  Psychiatric: Pt has a normal mood and affect. Behavior is normal. Judgment and thought content normal.  Nursing note and vitals reviewed.  ED Results / Procedures / Treatments   Labs (all labs ordered are listed, but only abnormal results are displayed) Labs Reviewed - No data to display  EKG None  Radiology No results found.  Procedures Procedures    Medications Ordered in ED Medications - No data to display  ED Course/ Medical Decision Making/ A&P    21 year old G1 P1 approximate 7 weeks postpartum here for evaluation of elevated blood pressure.  She is afebrile, nonseptic, not ill-appearing.  Has had labile blood pressures since delivery, per patient.  Was previously placed on Procardia, taken off 2 weeks ago due to improved Bps at home.  She has had numerous work-ups at [facility]U which ruled out postpartum eclampsia including most recent 15 minutes prior to arrival here in the emergency department.  Intermittently taking blood pressure at home which she states has been elevated into the 140s systolic.  Had headache yesterday which resolved.  No sudden onset thunderclap headache.  Does have family history of aneurysm however she has never been assessed for this.  No current headache, numbness, weakness, neck pain, neck stiffness, facial droop, difficulty word finding, emesis, abdominal pain, vision changes.  Patient appears otherwise well.  Had long discussion with patient.  Her blood pressure currently in room is 113/72.  Low suspicion for hypertensive urgency or emergency.  While she does have a  family history of brain aneurysm she has no current headache, has a nonfocal neuro exam and is normotensive and denies "sudden onset" headache.  I do not feel she needs additional imaging or labs at this time.  I have low suspicion for post partum eclampsia.  I discussed close follow-up with OB/GYN, PCP as well as keeping a log of her blood pressures. I discussed strict return precautions which should cause her to return to the emergency department.  DC home in stable condition.  Patient asymptomatic without any evidence of hypertension.  She will follow-up outpatient.  I do not feel she needs additional labs, imaging, hospitalization at this time.  The patient has been appropriately medically screened and/or stabilized in the ED. I have low suspicion for any other emergent medical condition which would require further screening, evaluation or treatment in the ED or require inpatient management.  Patient is hemodynamically stable and in no acute distress.  Patient able to ambulate in department prior to ED.  Evaluation does not show acute pathology that would require ongoing or additional emergent interventions while in the emergency department or further inpatient treatment.  I have discussed the diagnosis with the patient and answered all questions.  Pain is been managed while in the emergency department and patient has no further complaints prior to discharge.  Patient is comfortable with plan discussed in room and is stable for discharge at  this time.  I have discussed strict return precautions for returning to the emergency department.  Patient was encouraged to follow-up with PCP/specialist refer to at discharge.                            Medical Decision Making Amount and/or Complexity of Data Reviewed External Data Reviewed: labs, radiology and notes.    Details: MAU note from PTA  Risk OTC drugs. Decision regarding hospitalization. Diagnosis or treatment significantly limited by social  determinants of health.         Final Clinical Impression(s) / ED Diagnoses Final diagnoses:  Physically well but worried    Rx / DC Orders ED Discharge Orders     None         Gilberto Stanforth A, PA-C 01/05/22 1831    Wynetta FinesMessick, Peter C, MD 01/09/22 1553

## 2022-01-05 NOTE — MAU Note (Signed)
Joy Dyer is a 21 y.o. at Unknown here in MAU reporting: has been monitoring BP @ home and BP's have been elevated.  Reports BP's were 129/89, 136/95, 148/91, and 147/117.  Reports have doesn't currently have a H/A, but had one earlier today.  Denies visual disturbances  and epigastric pain.  S/P NVD 11/17/2021.  Onset of complaint: last night Pain score: 0 Vitals:   01/05/22 1712  BP: 124/77  Pulse: 74  Temp: 99.1 F (37.3 C)  SpO2: 100%     FHT:N/A Lab orders placed from triage:   None

## 2022-01-05 NOTE — MAU Provider Note (Signed)
Event Date/Time   First Provider Initiated Contact with Patient 01/05/22 1716      Joy Dyer is a 21 y.o. G1P1001 patient who presents to MAU today with complaint of hypertension. She reports she has been watching her blood pressures at home and they have been elevated. She reports intermittent headaches but no pain now. No shortness of breath or chest pain   O BP 124/77 (BP Location: Right Arm)   Pulse 74   Temp 99.1 F (37.3 C) (Oral)   Ht 5' (1.524 m)   Wt 50.7 kg   SpO2 100%   BMI 21.81 kg/m  Physical Exam Vitals and nursing note reviewed.  Constitutional:      General: She is not in acute distress.    Appearance: She is well-developed.  HENT:     Head: Normocephalic.  Eyes:     Pupils: Pupils are equal, round, and reactive to light.  Cardiovascular:     Rate and Rhythm: Normal rate and regular rhythm.     Heart sounds: Normal heart sounds.  Pulmonary:     Effort: Pulmonary effort is normal. No respiratory distress.     Breath sounds: Normal breath sounds.  Abdominal:     General: Bowel sounds are normal. There is no distension.     Palpations: Abdomen is soft.     Tenderness: There is no abdominal tenderness.  Skin:    General: Skin is warm and dry.  Neurological:     Mental Status: She is alert and oriented to person, place, and time.  Psychiatric:        Mood and Affect: Mood normal.        Behavior: Behavior normal.        Thought Content: Thought content normal.        Judgment: Judgment normal.    A Medical screening exam complete 1. Physically well but worried    Discussed that she is normotensive today and 7 weeks PP. Encouraged patient to follow up with PCP. Patient feels that this needs to be evaluated today and desires to go to urgent care  P Discharge from MAU in stable condition Patient given the option of transfer to [facility] for further evaluation or seek care in outpatient facility of choice  List of options for follow-up given   Warning signs for worsening condition that would warrant emergency follow-up discussed Patient may return to MAU as needed   Rolm Bookbinder, [place] 01/05/2022 5:16 PM

## 2022-01-05 NOTE — ED Triage Notes (Signed)
Pt states she is 7 weeks post partum. Did take BP meds as prescribed post partum for her but they were discontinued on May 3.  Pt states she had a HA yesterday and started taking her BP and reports diastolic in the 80s last night, 90s, this morning, and progressing to diastolic of 117 at home prior to coming in to the ED.  Systolic BP never above 130 at home.

## 2022-01-05 NOTE — Discharge Instructions (Signed)
Please keep close monitoring of your blood pressures.   If you develop sudden onset headache, vision changes, numbness, unilateral weakness, upper abdominal pain please seek reevaluation  Otherwise follow-up with your OB/GYN and PCP

## 2022-01-09 DIAGNOSIS — R067 Sneezing: Secondary | ICD-10-CM | POA: Diagnosis not present

## 2022-01-09 DIAGNOSIS — J029 Acute pharyngitis, unspecified: Secondary | ICD-10-CM | POA: Diagnosis not present

## 2022-01-09 DIAGNOSIS — R0981 Nasal congestion: Secondary | ICD-10-CM | POA: Diagnosis not present

## 2022-01-11 DIAGNOSIS — Z20822 Contact with and (suspected) exposure to covid-19: Secondary | ICD-10-CM | POA: Diagnosis not present

## 2022-01-17 DIAGNOSIS — Z419 Encounter for procedure for purposes other than remedying health state, unspecified: Secondary | ICD-10-CM | POA: Diagnosis not present

## 2022-02-16 DIAGNOSIS — Z419 Encounter for procedure for purposes other than remedying health state, unspecified: Secondary | ICD-10-CM | POA: Diagnosis not present

## 2022-02-25 ENCOUNTER — Encounter (HOSPITAL_COMMUNITY): Payer: Self-pay

## 2022-02-25 ENCOUNTER — Ambulatory Visit (HOSPITAL_COMMUNITY)
Admission: EM | Admit: 2022-02-25 | Discharge: 2022-02-25 | Disposition: A | Discharge status: Home / Self Care | Payer: Medicaid Other | Attending: Physician Assistant | Admitting: Physician Assistant

## 2022-02-25 DIAGNOSIS — N898 Other specified noninflammatory disorders of vagina: Secondary | ICD-10-CM | POA: Insufficient documentation

## 2022-02-25 LAB — POC URINE PREG, ED: Preg Test, Ur: NEGATIVE

## 2022-02-25 MED ORDER — METRONIDAZOLE 0.75 % VA GEL: 1.0000 | Freq: Every day | VAGINAL | 0 refills | Status: DC

## 2022-02-25 NOTE — ED Triage Notes (Signed)
Pt c/o white discharge with an odor since yesterday. States having unprotected intercourse.

## 2022-02-25 NOTE — ED Provider Notes (Signed)
[facility]    CSN: [id number] Arrival date & time: 02/25/22  1749      History   Chief Complaint Chief Complaint  Patient presents with   Vaginal Discharge    HPI Joy Dyer is a 21 y.o. female.   Patient presents today with a 2-day history of vaginal discharge.  Reports that discharge is clear, copious, malodorous.  She denies any abdominal pain, pelvic pain, fever, nausea, vomiting.  She is sexually active with female partners and does not consistently use condoms.  She does not believe that she is pregnant as she had her menstrual cycle last month.  She is not breast-feeding.  Denies any changes to personal hygiene products including soaps or detergents.  Denies any recent antibiotics.  She has not tried any over-the-counter medication for symptom management.  She declined HIV and syphilis testing.    Past Medical History:  Diagnosis Date   Anxiety    Exacerbated by pregnancy; also excessive by grandfather in ICU.   Headache     Patient Active Problem List   Diagnosis Date Noted   Irregular heartbeat 10/18/2021   Sinus tachycardia by electrocardiogram 08/31/2021   Near syncope 08/31/2021   Systolic murmur 08/31/2021    Past Surgical History:  Procedure Laterality Date   None      OB History     Gravida  1   Para  1   Term  1   Preterm      AB      Living  1      SAB      IAB      Ectopic      Multiple  0   Live Births  1            Home Medications    Prior to Admission medications   Medication Sig Start Date End Date Taking? Authorizing Provider  metroNIDAZOLE (METROGEL VAGINAL) 0.75 % vaginal gel Place 1 Applicatorful vaginally at bedtime. 02/25/22  Yes Joy Dyer, Joy Retort, PA-C    Family History Family History  Problem Relation Age of Onset   Aneurysm Mother    Hypertension Father    Diabetes Father    Healthy Sister    Healthy Brother    Aneurysm Maternal Grandmother    Stroke Maternal Grandfather    Cancer  Maternal Grandfather    Healthy Paternal Grandmother    Healthy Maternal Aunt     Social History Social History   Tobacco Use   Smoking status: Never   Smokeless tobacco: Never  Vaping Use   Vaping Use: Former  Substance Use Topics   Alcohol use: Not Currently   Drug use: Never     Allergies   Patient has no known allergies.   Review of Systems Review of Systems  Constitutional:  Positive for activity change. Negative for appetite change, fatigue and fever.  Respiratory:  Negative for cough and shortness of breath.   Cardiovascular:  Negative for chest pain.  Gastrointestinal:  Negative for abdominal pain, diarrhea, nausea and vomiting.  Genitourinary:  Positive for vaginal discharge. Negative for dysuria, frequency, pelvic pain, urgency, vaginal bleeding and vaginal pain.     Physical Exam Triage Vital Signs ED Triage Vitals  Enc Vitals Group     BP 02/25/22 1839 126/85     Pulse Rate 02/25/22 1839 83     Resp 02/25/22 1839 18     Temp 02/25/22 1839 99.5 F (37.5 C)     Temp  Source 02/25/22 1839 Oral     SpO2 02/25/22 1839 96 %     Weight --      Height --      Head Circumference --      Peak Flow --      Pain Score 02/25/22 1840 0     Pain Loc --      Pain Edu? --      Excl. in GC? --    No data found.  Updated Vital Signs BP 126/85   Pulse 83   Temp 99.5 F (37.5 C) (Oral)   Resp 18   LMP 01/31/2022   SpO2 96%   Breastfeeding No   Visual Acuity Right Eye Distance:   Left Eye Distance:   Bilateral Distance:    Right Eye Near:   Left Eye Near:    Bilateral Near:     Physical Exam Vitals reviewed.  Constitutional:      General: She is awake. She is not in acute distress.    Appearance: Normal appearance. She is well-developed. She is not ill-appearing.     Comments: Very pleasant female appears stated age in no acute distress sitting comfortably in exam room  HENT:     Head: Normocephalic and atraumatic.  Cardiovascular:     Rate and  Rhythm: Normal rate and regular rhythm.     Heart sounds: Normal heart sounds, S1 normal and S2 normal. No murmur heard. Pulmonary:     Effort: Pulmonary effort is normal.     Breath sounds: Normal breath sounds. No wheezing, rhonchi or rales.     Comments: Clear to auscultation bilaterally Abdominal:     General: Bowel sounds are normal.     Palpations: Abdomen is soft.     Tenderness: There is no abdominal tenderness. There is no right CVA tenderness, left CVA tenderness, guarding or rebound.     Comments: Benign abdominal exam  Genitourinary:    Comments: Exam deferred Psychiatric:        Behavior: Behavior is cooperative.      UC Treatments / Results  Labs (all labs ordered are listed, but only abnormal results are displayed) Labs Reviewed  POC URINE PREG, ED  CERVICOVAGINAL ANCILLARY ONLY    EKG   Radiology No results found.  Procedures Procedures (including critical care time)  Medications Ordered in UC Medications - No data to display  Initial Impression / Assessment and Plan / UC Course  I have reviewed the triage vital signs and the nursing notes.  Pertinent labs & imaging results that were available during my care of the patient were reviewed by me and considered in my medical decision making (see chart for details).     Urine pregnancy test was negative.  STI swab collected today-results pending.  We will medically treat for bacterial vaginosis.  Offered metronidazole tablets but patient preferred cream due to associated GI side effects.  Discussed that she should not drink any alcohol with this medication due to risk of Antabuse side effects.  She is to wear hypoallergenic soaps or detergents or loosefitting cotton underwear.  She is to abstain from sex until results are available.  Discussed that if she is positive all partners in the past and treated as well.  Discussed that if she develops any symptoms she needs to return for reevaluation.  Strict return  precautions given.  Final Clinical Impressions(s) / UC Diagnoses   Final diagnoses:  Vaginal discharge     Discharge Instructions  We are treating for bacterial vaginosis.  Use MetroGel as prescribed.  Wear loosefitting cotton underwear and use hypoallergenic soaps and detergents.  We will contact you if anything is positive we will need to arrange additional treatment.  You should not have sex until results are available.  If you develop any symptoms please return for reevaluation.     ED Prescriptions     Medication Sig Dispense Auth. Provider   metroNIDAZOLE (METROGEL VAGINAL) 0.75 % vaginal gel Place 1 Applicatorful vaginally at bedtime. 70 g Calixto Pavel, Joy Retort, PA-C      PDMP not reviewed this encounter.   Jeani Hawking, PA-C 02/25/22 1936

## 2022-02-25 NOTE — Discharge Instructions (Signed)
We are treating for bacterial vaginosis.  Use MetroGel as prescribed.  Wear loosefitting cotton underwear and use hypoallergenic soaps and detergents.  We will contact you if anything is positive we will need to arrange additional treatment.  You should not have sex until results are available.  If you develop any symptoms please return for reevaluation.

## 2022-02-26 ENCOUNTER — Telehealth (HOSPITAL_COMMUNITY): Payer: Self-pay | Admitting: Emergency Medicine

## 2022-02-26 LAB — CERVICOVAGINAL ANCILLARY ONLY
Bacterial Vaginitis (gardnerella): POSITIVE — AB
Candida Glabrata: NEGATIVE
Candida Vaginitis: POSITIVE — AB
Chlamydia: NEGATIVE
Comment: NEGATIVE
Comment: NEGATIVE
Comment: NEGATIVE
Comment: NEGATIVE
Comment: NEGATIVE
Comment: NORMAL
Neisseria Gonorrhea: NEGATIVE
Trichomonas: NEGATIVE

## 2022-02-26 MED ORDER — FLUCONAZOLE 150 MG PO TABS: 150.0000 mg | ORAL_TABLET | Freq: Once | ORAL | 0 refills | Status: AC

## 2022-03-19 DIAGNOSIS — Z419 Encounter for procedure for purposes other than remedying health state, unspecified: Secondary | ICD-10-CM | POA: Diagnosis not present

## 2022-03-26 ENCOUNTER — Ambulatory Visit (HOSPITAL_COMMUNITY)
Admission: EM | Admit: 2022-03-26 | Discharge: 2022-03-26 | Disposition: A | Discharge status: Home / Self Care | Payer: Medicaid Other | Attending: Emergency Medicine | Admitting: Emergency Medicine

## 2022-03-26 ENCOUNTER — Encounter (HOSPITAL_COMMUNITY): Payer: Self-pay

## 2022-03-26 DIAGNOSIS — N898 Other specified noninflammatory disorders of vagina: Secondary | ICD-10-CM | POA: Insufficient documentation

## 2022-03-26 LAB — POCT URINALYSIS DIPSTICK, ED / UC
Bilirubin Urine: NEGATIVE
Glucose, UA: NEGATIVE mg/dL
Ketones, ur: NEGATIVE mg/dL
Nitrite: NEGATIVE
Protein, ur: 30 mg/dL — AB
Specific Gravity, Urine: 1.025 (ref 1.005–1.030)
Urobilinogen, UA: 0.2 mg/dL (ref 0.0–1.0)
pH: 6.5 (ref 5.0–8.0)

## 2022-03-26 LAB — POC URINE PREG, ED: Preg Test, Ur: NEGATIVE

## 2022-03-26 MED ORDER — FLUCONAZOLE 150 MG PO TABS: 150.0000 mg | ORAL_TABLET | Freq: Every day | ORAL | 0 refills | Status: AC

## 2022-03-26 MED ORDER — METRONIDAZOLE 0.75 % VA GEL: 1.0000 | Freq: Every day | VAGINAL | 0 refills | Status: DC

## 2022-03-26 NOTE — ED Notes (Signed)
Pt drinking PO fluids in order to provide urine sample.

## 2022-03-26 NOTE — Discharge Instructions (Addendum)
Due to the similarity in your symptoms when you are positive for BV and yeast we will prophylactically treat you today  Urine pregnancy negative  Take metronidazole every morning and every evening for the next 7 days, avoid alcohol while using this medicine as it will make you sick  Take 1 Diflucan pill today then take the second Diflucan pill after the completion of the metronidazole  Urinalysis only showing very small amount of Jarita Joy Dyer blood cells but does not show bacteria, it has been sent to the lab to determine if any bacteria will grow, if this occurs you will be notified  Labs pending 2-3 days, you will be contacted if positive for any sti and treatment will be sent to the pharmacy, you will have to return to the clinic if positive for gonorrhea to receive treatment   Please refrain from having sex until labs results, if positive please refrain from having sex until treatment complete and symptoms resolve   If positive for  Chlamydia  gonorrhea or trichomoniasis please notify partner or partners so they may tested as well  Moving forward, it is recommended you use some form of protection against the transmission of sti infections  such as condoms or dental dams with each sexual encounter

## 2022-03-26 NOTE — ED Triage Notes (Signed)
Patient having abdominal pain with urination and burning urination. Patient has also been spotting outside of her period. Patient having low back pain, vaginal discharge, urgency, and pressure.   No change in odor of the urine or dark urine.

## 2022-03-26 NOTE — ED Provider Notes (Signed)
[facility]    CSN: [id number] Arrival date & time: 03/26/22  8250      History   Chief Complaint Chief Complaint  Patient presents with   Abdominal Pain   Dysuria    HPI Joy Dyer is a 21 y.o. female.   Patient presents with copious clear vaginal discharge described as slimy, vaginal spotting, lower abdominal pressure, urinary frequency, urgency and dysuria beginning 4 days ago.  Sexually active, 1 female partner, no condom use, no known exposure.  Had similar symptoms approximately 1 month ago in which she was positive for yeast and bacterial vaginosis, completed treatment and symptoms resolved.  Denies hematuria, new rash or lesions, flank pain, fever, chills.   Past Medical History:  Diagnosis Date   Anxiety    Exacerbated by pregnancy; also excessive by grandfather in ICU.   Headache     Patient Active Problem List   Diagnosis Date Noted   Irregular heartbeat 10/18/2021   Sinus tachycardia by electrocardiogram 08/31/2021   Near syncope 08/31/2021   Systolic murmur 08/31/2021    Past Surgical History:  Procedure Laterality Date   None      OB History     Gravida  1   Para  1   Term  1   Preterm      AB      Living  1      SAB      IAB      Ectopic      Multiple  0   Live Births  1            Home Medications    Prior to Admission medications   Medication Sig Start Date End Date Taking? Authorizing Provider  metroNIDAZOLE (METROGEL VAGINAL) 0.75 % vaginal gel Place 1 Applicatorful vaginally at bedtime. 02/25/22   Raspet, Noberto Retort, PA-C    Family History Family History  Problem Relation Age of Onset   Aneurysm Mother    Hypertension Father    Diabetes Father    Healthy Sister    Healthy Brother    Aneurysm Maternal Grandmother    Stroke Maternal Grandfather    Cancer Maternal Grandfather    Healthy Paternal Grandmother    Healthy Maternal Aunt     Social History Social History   Tobacco Use   Smoking status:  Never   Smokeless tobacco: Never  Vaping Use   Vaping Use: Former  Substance Use Topics   Alcohol use: Not Currently   Drug use: Never     Allergies   Patient has no known allergies.   Review of Systems Review of Systems  Constitutional: Negative.   Gastrointestinal: Negative.   Genitourinary:  Positive for dysuria, flank pain, frequency, pelvic pain, urgency and vaginal discharge. Negative for decreased urine volume, difficulty urinating, dyspareunia, enuresis, genital sores, hematuria, menstrual problem, vaginal bleeding and vaginal pain.  Skin: Negative.      Physical Exam Triage Vital Signs ED Triage Vitals  Enc Vitals Group     BP 03/26/22 0857 113/75     Pulse Rate 03/26/22 0857 70     Resp 03/26/22 0857 16     Temp 03/26/22 0857 98.7 F (37.1 C)     Temp Source 03/26/22 0857 Oral     SpO2 03/26/22 0857 96 %     Weight 03/26/22 0858 107 lb (48.5 kg)     Height 03/26/22 0858 4\' 11"  (1.499 m)     Head Circumference --  Peak Flow --      Pain Score 03/26/22 0857 6     Pain Loc --      Pain Edu? --      Excl. in GC? --    No data found.  Updated Vital Signs BP 113/75 (BP Location: Right Arm)   Pulse 70   Temp 98.7 F (37.1 C) (Oral)   Resp 16   Ht 4\' 11"  (1.499 m)   Wt 107 lb (48.5 kg)   LMP 03/05/2022 (Exact Date)   SpO2 96%   Breastfeeding No   BMI 21.61 kg/m   Visual Acuity Right Eye Distance:   Left Eye Distance:   Bilateral Distance:    Right Eye Near:   Left Eye Near:    Bilateral Near:     Physical Exam Constitutional:      Appearance: Normal appearance. She is well-developed.  Pulmonary:     Effort: Pulmonary effort is normal.  Abdominal:     General: Abdomen is flat. Bowel sounds are normal.     Palpations: Abdomen is soft.     Tenderness: There is abdominal tenderness in the suprapubic area. There is no right CVA tenderness or left CVA tenderness.  Genitourinary:    Comments: deferred Skin:    General: Skin is warm and  dry.  Neurological:     General: No focal deficit present.     Mental Status: She is alert and oriented to person, place, and time.  Psychiatric:        Mood and Affect: Mood normal.        Behavior: Behavior normal.      UC Treatments / Results  Labs (all labs ordered are listed, but only abnormal results are displayed) Labs Reviewed  POCT URINALYSIS DIPSTICK, ED / UC  CERVICOVAGINAL ANCILLARY ONLY    EKG   Radiology No results found.  Procedures Procedures (including critical care time)  Medications Ordered in UC Medications - No data to display  Initial Impression / Assessment and Plan / UC Course  I have reviewed the triage vital signs and the nursing notes.  Pertinent labs & imaging results that were available during my care of the patient were reviewed by me and considered in my medical decision making (see chart for details).  Vaginal discharge  Urine pregnancy negative, urinalysis showing trace Joy Dyer blood cells, negative for nitrates, sent for culture, STI labs are pending, based on similarity and previous symptoms we will prophylactically cover for BV and yeast, MetroGel prescribed as well as Diflucan, discussed administration, will treat further per protocol, patient endorses history of reoccurring BV, recommended over-the-counter suppositories for prophylactic treatment, for reoccurring symptoms may follow-up with urgent care as needed Final Clinical Impressions(s) / UC Diagnoses   Final diagnoses:  None   Discharge Instructions   None    ED Prescriptions   None    PDMP not reviewed this encounter.   03/07/2022, Valinda Hoar 03/26/22 [phone]

## 2022-03-27 LAB — CERVICOVAGINAL ANCILLARY ONLY
Bacterial Vaginitis (gardnerella): POSITIVE — AB
Candida Glabrata: NEGATIVE
Candida Vaginitis: POSITIVE — AB
Chlamydia: NEGATIVE
Comment: NEGATIVE
Comment: NEGATIVE
Comment: NEGATIVE
Comment: NEGATIVE
Comment: NEGATIVE
Comment: NORMAL
Neisseria Gonorrhea: NEGATIVE
Trichomonas: NEGATIVE

## 2022-03-28 LAB — URINE CULTURE: Culture: >=100000 — AB

## 2022-03-29 ENCOUNTER — Telehealth (HOSPITAL_COMMUNITY): Payer: Self-pay | Admitting: Emergency Medicine

## 2022-03-29 MED ORDER — NITROFURANTOIN MONOHYD MACRO 100 MG PO CAPS: 100.0000 mg | ORAL_CAPSULE | Freq: Two times a day (BID) | ORAL | 0 refills | Status: DC

## 2022-04-01 ENCOUNTER — Encounter: Payer: Self-pay | Admitting: Pediatrics

## 2022-04-19 DIAGNOSIS — Z419 Encounter for procedure for purposes other than remedying health state, unspecified: Secondary | ICD-10-CM | POA: Diagnosis not present

## 2022-04-25 ENCOUNTER — Telehealth: Payer: Medicaid Other | Admitting: Cardiology

## 2022-04-29 NOTE — Progress Notes (Deleted)
Cardiology Office Note    Date:  04/29/2022   ID:  Joy Dyer, DOB 2001-07-12, MRN [id number]  PCP:  [facility]  Cardiologist:  Bryan Lemma, MD  Electrophysiologist:  None   Chief Complaint: Follow-up  History of Present Illness:   Joy Dyer is a 21 y.o. female with history of near syncope and tachypalpitations who presents for follow-up of the above.  She was evaluated as a new patient by Dr. Herbie Baltimore in 08/2021 for near syncope and tachypalpitations in the context of gravid state felt to be related to vasovagal episode with dehydration with baseline tachycardia as well as possible fetal movement affecting IVC flow.  Zio patch in 08/2021 showed a predominant rhythm of sinus with an average rate of 94 bpm (range 58 to 180 bpm) rare isolated PACs and PVCs, no atrial runs, sustained arrhythmias, or pauses.  Symptoms of lightheadedness and dizziness were associated with sinus rhythm and sinus tachycardia.  Echo in 09/2021 showed an EF of 65 to 70%, normal wall motion, normal LV diastolic function parameters, normal RV systolic function and ventricular cavity size, no significant valvular abnormalities, and an estimated right atrial pressure of 3 mmHg.  She was last seen in the office in 10/2021 and had no further episodes of near syncope with symptoms again felt to be vasovagal in etiology.  Cardiac murmur was suspected to be flow murmur.  ***   Labs independently reviewed: 11/2021 - potassium 3.8, BUN 14, serum creatinine 0.85, albumin 3.0, AST/ALT normal, Hgb 10.7, PLT 631  Past Medical History:  Diagnosis Date   Anxiety    Exacerbated by pregnancy; also excessive by grandfather in ICU.   Headache     Past Surgical History:  Procedure Laterality Date   None      Current Medications: No outpatient medications have been marked as taking for the 04/30/22 encounter (Appointment) with Sondra Barges, PA-C.    Allergies:   Patient has no known allergies.    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  Off-and-on living with her boyfriend (who is the father of her unborn child).  She has significant anxiety about their relationship and about his ability to have maturity to be an active father.  She acknowledges that he has 51 year old boy and may not have the maturity to stick around.      She also has significant stress going on right now.  Her grandfather is in the ICU at [facility].  It sounds like he is probably being placed on comfort care measures.  She is quite sad and under lots of stress.   Social Determinants of Health   Financial Resource Strain: Not on file  Food Insecurity: Not on file  Transportation Needs: Not on file  Physical Activity: Not on file  Stress: Not on file  Social Connections: Not on file     Family History:  The patient's family history includes Aneurysm in her maternal grandmother and mother; Cancer in her maternal grandfather; Diabetes in her father; Healthy in her brother, maternal aunt, paternal grandmother, and sister; Hypertension in her father; Stroke in her maternal grandfather.  ROS:   ROS   EKGs/Labs/Other Studies Reviewed:    Studies reviewed were summarized above. The additional studies were reviewed today:  Zio patch 08/2021: Sinus rhythm with rate range 58 to 180 bpm average 94 bpm. Rare isolated PACs and PVCs. No  atrial runs or sustained arrhythmias. No pauses. Fastest heart rate was on 09/16/2021 at 1:30-34 AM-sinus tachycardia rate range 160 to 194 bpm.     Patch Wear Time:  10 days and 20 hours (2023-01-18T23:31:30-0500 to 2023-01-29T20:10:25-0500)   Symptoms of lightheadedness and dizziness associate with sinus rhythm and sinus tachycardia rate ranged from 94 to 110 bpm.  1 episode of feeling hot and flushed with sweating and pounding in the chest associate with sinus tachycardia 103 bpm.   Relatively normal monitor. __________  2D echo 09/19/2021: 1. Left ventricular ejection fraction, by estimation, is 65 to 70%. Left  ventricular ejection fraction by 2D MOD biplane is 65.9 %. The left  ventricle has normal function. The left ventricle has no regional wall  motion abnormalities. Left ventricular  diastolic parameters were normal.   2. Right ventricular systolic function is normal. The right ventricular  size is normal.   3. The mitral valve is normal in structure. No evidence of mitral valve  regurgitation.   4. The aortic valve was not well visualized. Aortic valve regurgitation  is not visualized. No aortic stenosis is present.   5. The inferior vena cava is normal in size with greater than 50%  respiratory variability, suggesting right atrial pressure of 3 mmHg.   EKG:  EKG is ordered today.  The EKG ordered today demonstrates ***  Recent Labs: 11/25/2021: ALT 25; BUN 14; Creatinine, Ser 0.85; Hemoglobin 10.7; Platelets 631; Potassium 3.8; Sodium 137  Recent Lipid Panel No results found for: "CHOL", "TRIG", "HDL", "CHOLHDL", "VLDL", "LDLCALC", "LDLDIRECT"  PHYSICAL EXAM:    VS:  There were no vitals taken for this visit.  BMI: There is no height or weight on file to calculate BMI.  Physical Exam  Wt Readings from Last 3 Encounters:  03/26/22 107 lb (48.5 kg)  01/05/22 111 lb 11.2 oz (50.7 kg)  12/19/21 114 lb (51.7 kg)     ASSESSMENT & PLAN:   Near  syncope:  Palpitations:  Murmur:   {Are you ordering a CV Procedure (e.g. stress test, cath, DCCV, TEE, etc)?   Press F2        :[id number]}     Disposition: F/u with Dr. Herbie Baltimore or an APP in ***.   Medication Adjustments/Labs and Tests Ordered: Current medicines are reviewed at length with the patient today.  Concerns regarding medicines are outlined above. Medication changes, Labs and Tests ordered today are summarized above and listed in the Patient Instructions accessible in Encounters.   Signed, Eula Listen, PA-C 04/29/2022 1:04 PM     [facility] [address] [place], [place] [address] [phone]

## 2022-04-30 ENCOUNTER — Encounter: Payer: Self-pay | Admitting: Physician Assistant

## 2022-04-30 ENCOUNTER — Ambulatory Visit: Payer: Medicaid Other | Attending: Cardiology | Admitting: Physician Assistant

## 2022-05-19 DIAGNOSIS — Z419 Encounter for procedure for purposes other than remedying health state, unspecified: Secondary | ICD-10-CM | POA: Diagnosis not present

## 2022-06-16 DIAGNOSIS — Z87891 Personal history of nicotine dependence: Secondary | ICD-10-CM | POA: Diagnosis not present

## 2022-06-16 DIAGNOSIS — R0989 Other specified symptoms and signs involving the circulatory and respiratory systems: Secondary | ICD-10-CM | POA: Diagnosis not present

## 2022-06-16 DIAGNOSIS — R079 Chest pain, unspecified: Secondary | ICD-10-CM | POA: Diagnosis not present

## 2022-06-16 DIAGNOSIS — R0789 Other chest pain: Secondary | ICD-10-CM | POA: Diagnosis not present

## 2022-07-12 ENCOUNTER — Ambulatory Visit (HOSPITAL_COMMUNITY)
Admission: EM | Admit: 2022-07-12 | Discharge: 2022-07-12 | Disposition: A | Discharge status: Home / Self Care | Payer: Medicaid Other | Attending: Internal Medicine | Admitting: Internal Medicine

## 2022-07-12 ENCOUNTER — Encounter (HOSPITAL_COMMUNITY): Payer: Self-pay | Admitting: Emergency Medicine

## 2022-07-12 DIAGNOSIS — N898 Other specified noninflammatory disorders of vagina: Secondary | ICD-10-CM | POA: Insufficient documentation

## 2022-07-12 DIAGNOSIS — J039 Acute tonsillitis, unspecified: Secondary | ICD-10-CM

## 2022-07-12 LAB — POCT RAPID STREP A, ED / UC: Streptococcus, Group A Screen (Direct): NEGATIVE

## 2022-07-12 MED ORDER — AMOXICILLIN 500 MG PO CAPS: 500.0000 mg | ORAL_CAPSULE | Freq: Two times a day (BID) | ORAL | 0 refills | Status: DC

## 2022-07-12 MED ORDER — FLUCONAZOLE 150 MG PO TABS: 150.0000 mg | ORAL_TABLET | Freq: Every day | ORAL | 0 refills | Status: AC

## 2022-07-12 NOTE — ED Provider Notes (Signed)
[facility]    CSN: [id number] Arrival date & time: 07/12/22  1012      History   Chief Complaint Chief Complaint  Patient presents with   Sore Throat    HPI Joy Dyer is a 21 y.o. female.   Patient presents with Joy Dyer thin milky discharge with mild itching for 3 days.  Endorses history of reoccurring yeast infections.  Sexually active, 1 partner, no condom use, no known exposure.  Has not attempted treatment.  Denies urinary symptoms, vaginal odor or irritation, new rash or lesions.  Patient concerned with sore throat beginning 3 days ago, noticed Joy Dyer patches to the throat this morning.  Tolerating food and liquids.  Known sick contact in household.  Denies fever, chills, body aches, congestion, ear pain, cough.   Patient Active Problem List   Diagnosis Date Noted   Irregular heartbeat 10/18/2021   Sinus tachycardia by electrocardiogram 08/31/2021   Near syncope 08/31/2021   Systolic murmur 08/31/2021    Past Surgical History:  Procedure Laterality Date   None      OB History     Gravida  1   Para  1   Term  1   Preterm      AB      Living  1      SAB      IAB      Ectopic      Multiple  0   Live Births  1            Home Medications    Prior to Admission medications   Medication Sig Start Date End Date Taking? Authorizing Provider  amoxicillin (AMOXIL) 500 MG capsule Take 1 capsule (500 mg total) by mouth 2 (two) times daily for 7 days. 07/12/22 07/19/22 Yes Joy Manka R, NP  fluconazole (DIFLUCAN) 150 MG tablet Take 1 tablet (150 mg total) by mouth daily for 2 doses. 07/12/22 07/14/22 Yes Joy Dyer, Joy Boone, NP  nitrofurantoin, macrocrystal-monohydrate, (MACROBID) 100 MG capsule Take 1 capsule (100 mg total) by mouth 2 (two) times daily. 03/29/22   Joy Dyer, Joy Mccreedy, MD  metroNIDAZOLE (METROGEL VAGINAL) 0.75 % vaginal gel Place 1 Applicatorful vaginally at bedtime. 03/26/22   Joy Hoar, NP    Family  History Family History  Problem Relation Age of Onset   Aneurysm Mother    Hypertension Father    Diabetes Father    Healthy Sister    Healthy Brother    Aneurysm Maternal Grandmother    Stroke Maternal Grandfather    Cancer Maternal Grandfather    Healthy Paternal Grandmother    Healthy Maternal Aunt     Social History Social History   Tobacco Use   Smoking status: Never   Smokeless tobacco: Never  Vaping Use   Vaping Use: Former  Substance Use Topics   Alcohol use: Not Currently   Drug use: Never     Allergies   Patient has no known allergies.   Review of Systems Review of Systems Defer to HPI    Physical Exam Triage Vital Signs ED Triage Vitals  Enc Vitals Group     BP 07/12/22 1130 121/85     Pulse Rate 07/12/22 1130 83     Resp 07/12/22 1130 16     Temp 07/12/22 1130 98 F (36.7 C)     Temp Source 07/12/22 1130 Oral     SpO2 07/12/22 1130 99 %     Weight --  Height --      Head Circumference --      Peak Flow --      Pain Score 07/12/22 1128 7     Pain Loc --      Pain Edu? --      Excl. in GC? --    No data found.  Updated Vital Signs BP 121/85 (BP Location: Right Arm)   Pulse 83   Temp 98 F (36.7 C) (Oral)   Resp 16   LMP 06/25/2022   SpO2 99%   Visual Acuity Right Eye Distance:   Left Eye Distance:   Bilateral Distance:    Right Eye Near:   Left Eye Near:    Bilateral Near:     Physical Exam Constitutional:      Appearance: She is well-developed.  HENT:     Head: Normocephalic.     Right Ear: Tympanic membrane and ear canal normal.     Left Ear: Tympanic membrane and ear canal normal.     Nose: No congestion or rhinorrhea.     Mouth/Throat:     Mouth: Mucous membranes are moist.     Pharynx: Posterior oropharyngeal erythema present.     Tonsils: Tonsillar exudate present. 2+ on the right. 2+ on the left.  Cardiovascular:     Rate and Rhythm: Normal rate and regular rhythm.     Heart sounds: Normal heart sounds.   Pulmonary:     Effort: Pulmonary effort is normal.     Breath sounds: Normal breath sounds.  Genitourinary:    Comments: deferred Musculoskeletal:     Cervical back: Normal range of motion and neck supple.  Skin:    General: Skin is warm and dry.  Neurological:     General: No focal deficit present.     Mental Status: She is alert and oriented to person, place, and time.  Psychiatric:        Mood and Affect: Mood normal.        Behavior: Behavior normal.      UC Treatments / Results  Labs (all labs ordered are listed, but only abnormal results are displayed) Labs Reviewed  CULTURE, GROUP A STREP [facility])  POCT RAPID STREP A, ED / UC  CERVICOVAGINAL ANCILLARY ONLY    EKG   Radiology No results found.  Procedures Procedures (including critical care time)  Medications Ordered in UC Medications - No data to display  Initial Impression / Assessment and Plan / UC Course  I have reviewed the triage vital signs and the nursing notes.  Pertinent labs & imaging results that were available during my care of the patient were reviewed by me and considered in my medical decision making (see chart for details).  Tonsillitis, vaginal discharge  Vital signs are stable and patient is in no signs of distress nontoxic-appearing, rapid strep test negative, sent for culture, erythema, tonsillar adenopathy and exudate is noted to the oropharynx and therefore based on presentation we will provide coverage for bacteria, amoxicillin 7-day course prescribed and recommended over-the-counter medication and additional measures for support, may follow-up with his urgent care as needed  Due to history of reoccurring yeast infections we will prophylactically treat while labs are pending, Diflucan sent to pharmacy and discussed administration, advised abstinence until lab results, treatment is complete and symptoms resolved, may follow-up with urgent care as Final Clinical Impressions(s) / UC  Diagnoses   Final diagnoses:  Tonsillitis  Vaginal discharge     Discharge Instructions  Today you are being treated for a sore throat and  prophylactically for yeast.   On exam there are Joy Dyer patches to your tonsils very large in your throat is red however your strep test is negative however based on the presentation of your throat we will start antibiotics for coverage for additional bacteria  Take amoxicillin every morning and every evening for 7 days  Take Diflucan the first day that you receive your medicine then take second dose after completion of all antibiotics  May use over-the-counter Tylenol and ibuprofen for management of any discomfort  May attempt use of salt water gargles, throat lozenges, warm liquids, soft foods, teaspoon of honey and over-the-counter Chloraseptic spray for additional management of sore throat  Yeast infections which are caused by a naturally occurring fungus called candida. Vaginosis is an inflammation of the vagina that can result in discharge, itching and pain. The cause is usually a change in the normal balance of vaginal bacteria or an infection. Vaginosis can also result from reduced estrogen levels after menopause.  Labs pending 2-3 days, you will be contacted if positive for any sti and treatment will be sent to the pharmacy, you will have to return to the clinic if positive for gonorrhea to receive treatment   Please refrain from having sex until labs results, if positive please refrain from having sex until treatment complete and symptoms resolve   If positive for , Chlamydia  gonorrhea or trichomoniasis please notify partner or partners so they may tested as well  Moving forward, it is recommended you use some form of protection against the transmission of sti infections  such as condoms or dental dams with each sexual encounter    In addition:   Avoid baths, hot tubs and whirlpool spas.  Don't use scented or harsh soaps, such as  those with deodorant or antibacterial action. Avoid irritants. These include scented tampons and pads. Wipe from front to back after using the toilet.  Don't douche. Your vagina doesn't require cleansing other than normal bathing.  Use a  condom. Wear cotton underwear, this fabric helps absorb moisture     ED Prescriptions     Medication Sig Dispense Auth. Provider   fluconazole (DIFLUCAN) 150 MG tablet Take 1 tablet (150 mg total) by mouth daily for 2 doses. 2 tablet Jonahtan Manseau R, NP   amoxicillin (AMOXIL) 500 MG capsule Take 1 capsule (500 mg total) by mouth 2 (two) times daily for 7 days. 14 capsule Aunika Kirsten, Joy Boone, NP      PDMP not reviewed this encounter.   Joy Hoar, NP 07/12/22 1152

## 2022-07-12 NOTE — ED Triage Notes (Signed)
Pt c/o sore throat with white spots for 2-3 days. Also having vaginal discharge and itching for a couple days, concerned for yeast infection

## 2022-07-12 NOTE — Discharge Instructions (Addendum)
Today you are being treated for a sore throat and  prophylactically for yeast.   On exam there are Joy Dyer patches to your tonsils very large in your throat is red however your strep test is negative however based on the presentation of your throat we will start antibiotics for coverage for additional bacteria  Take amoxicillin every morning and every evening for 7 days  Take Diflucan the first day that you receive your medicine then take second dose after completion of all antibiotics  May use over-the-counter Tylenol and ibuprofen for management of any discomfort  May attempt use of salt water gargles, throat lozenges, warm liquids, soft foods, teaspoon of honey and over-the-counter Chloraseptic spray for additional management of sore throat  Yeast infections which are caused by a naturally occurring fungus called candida. Vaginosis is an inflammation of the vagina that can result in discharge, itching and pain. The cause is usually a change in the normal balance of vaginal bacteria or an infection. Vaginosis can also result from reduced estrogen levels after menopause.  Labs pending 2-3 days, you will be contacted if positive for any sti and treatment will be sent to the pharmacy, you will have to return to the clinic if positive for gonorrhea to receive treatment   Please refrain from having sex until labs results, if positive please refrain from having sex until treatment complete and symptoms resolve   If positive for , Chlamydia  gonorrhea or trichomoniasis please notify partner or partners so they may tested as well  Moving forward, it is recommended you use some form of protection against the transmission of sti infections  such as condoms or dental dams with each sexual encounter    In addition:   Avoid baths, hot tubs and whirlpool spas.  Don't use scented or harsh soaps, such as those with deodorant or antibacterial action. Avoid irritants. These include scented tampons and  pads. Wipe from front to back after using the toilet.  Don't douche. Your vagina doesn't require cleansing other than normal bathing.  Use a  condom. Wear cotton underwear, this fabric helps absorb moisture

## 2022-07-15 LAB — CULTURE, GROUP A STREP (THRC)

## 2022-07-15 LAB — CERVICOVAGINAL ANCILLARY ONLY
Bacterial Vaginitis (gardnerella): POSITIVE — AB
Candida Glabrata: NEGATIVE
Candida Vaginitis: POSITIVE — AB
Chlamydia: POSITIVE — AB
Comment: NEGATIVE
Comment: NEGATIVE
Comment: NEGATIVE
Comment: NEGATIVE
Comment: NEGATIVE
Comment: NORMAL
Neisseria Gonorrhea: NEGATIVE
Trichomonas: NEGATIVE

## 2022-07-16 ENCOUNTER — Encounter (HOSPITAL_COMMUNITY): Payer: Self-pay | Admitting: Emergency Medicine

## 2022-07-16 ENCOUNTER — Telehealth (HOSPITAL_COMMUNITY): Payer: Self-pay | Admitting: Emergency Medicine

## 2022-07-16 ENCOUNTER — Ambulatory Visit (HOSPITAL_COMMUNITY)
Admission: EM | Admit: 2022-07-16 | Discharge: 2022-07-16 | Disposition: A | Discharge status: Home / Self Care | Payer: Self-pay | Attending: Family Medicine | Admitting: Family Medicine

## 2022-07-16 DIAGNOSIS — Z113 Encounter for screening for infections with a predominantly sexual mode of transmission: Secondary | ICD-10-CM

## 2022-07-16 DIAGNOSIS — Z711 Person with feared health complaint in whom no diagnosis is made: Secondary | ICD-10-CM | POA: Insufficient documentation

## 2022-07-16 DIAGNOSIS — N898 Other specified noninflammatory disorders of vagina: Secondary | ICD-10-CM | POA: Insufficient documentation

## 2022-07-16 MED ORDER — METRONIDAZOLE 500 MG PO TABS: 500.0000 mg | ORAL_TABLET | Freq: Two times a day (BID) | ORAL | 0 refills | Status: DC

## 2022-07-16 MED ORDER — DOXYCYCLINE HYCLATE 100 MG PO CAPS: 100.0000 mg | ORAL_CAPSULE | Freq: Two times a day (BID) | ORAL | 0 refills | Status: AC

## 2022-07-16 NOTE — ED Triage Notes (Signed)
Pt was seen here on 11/24 and had STD testing. Pt got called today by Florentina Addison informed of abnormal test results and about prescriptions that were sent to pharmacy. Pt reports that she only has the one partner and he was seen in [facility] system last night and got test results today where he was negative, so patient wanting re-testing because she doesn't feel test results are correct and wanting to talk to a provider.

## 2022-07-17 LAB — CERVICOVAGINAL ANCILLARY ONLY
Bacterial Vaginitis (gardnerella): NEGATIVE
Candida Glabrata: NEGATIVE
Candida Vaginitis: POSITIVE — AB
Chlamydia: POSITIVE — AB
Comment: NEGATIVE
Comment: NEGATIVE
Comment: NEGATIVE
Comment: NEGATIVE
Comment: NEGATIVE
Comment: NORMAL
Neisseria Gonorrhea: NEGATIVE
Trichomonas: NEGATIVE

## 2022-07-17 NOTE — ED Provider Notes (Signed)
[facility] CARE CENTER   [id number] 07/16/22 Arrival Time: 1717  ASSESSMENT & PLAN:  1. Vaginal discharge   2. Concern about STD in female without diagnosis    Desires repeat vaginal cytology and will start doxycycline this evening while we await confirmatory results.  Without s/s of PID.  Labs Reviewed  CERVICOVAGINAL ANCILLARY ONLY   Reviewed expectations re: course of current medical issues. Questions answered. Outlined signs and symptoms indicating need for more acute intervention. Patient verbalized understanding. After Visit Summary given.   SUBJECTIVE:  Joy Dyer is a 21 y.o. female who returns here after testing positive Chlamydia testing. Questions about accuracy of test as female partner's urine test at [facility] was negative last evening for Chlamydia. She does report having scant vaginal discharge. No specific aggravating or alleviating factors reported. Denies: urinary frequency, dysuria, and gross hematuria. Afebrile. No abdominal or pelvic pain. Normal PO intake wihout n/v. No genital rashes or lesions.   Patient's last menstrual period was 06/25/2022.   OBJECTIVE:  Vitals:   07/16/22 1828  BP: 125/81  Pulse: 77  Resp: 16  Temp: 97.8 F (36.6 C)  TempSrc: Oral  SpO2: 98%    General appearance: alert, cooperative, appears stated age and no distress Lungs: unlabored respirations; speaks full sentences without difficulty Back: no CVA tenderness; FROM at waist Abdomen: soft, non-tender GU: deferred Skin: warm and dry Psychological: alert and cooperative; normal mood and affect.  Results for orders placed or performed during the hospital encounter of 07/12/22  Culture, group A strep (throat)   Specimen: Throat  Result Value Ref Range   Specimen Description THROAT    Special Requests NONE    Culture      NO GROUP A STREP (S.PYOGENES) ISOLATED Performed at [facility], [address] [place], [place] [address]    Report Status 07/15/2022 FINAL    POCT Rapid Strep A  Result Value Ref Range   Streptococcus, Group A Screen (Direct) NEGATIVE NEGATIVE  Cervicovaginal ancillary only  Result Value Ref Range   Neisseria Gonorrhea Negative    Chlamydia Positive (A)    Trichomonas Negative    Bacterial Vaginitis (gardnerella) Positive (A)    Candida Vaginitis Positive (A)    Candida Glabrata Negative    Comment      Normal Reference Range Bacterial Vaginosis - Negative   Comment Normal Reference Range Candida Species - Negative    Comment Normal Reference Range Candida Galbrata - Negative    Comment Normal Reference Range Trichomonas - Negative    Comment Normal Reference Ranger Chlamydia - Negative    Comment      Normal Reference Range Neisseria Gonorrhea - Negative    Labs Reviewed  CERVICOVAGINAL ANCILLARY ONLY    No Known Allergies  Past Medical History:  Diagnosis Date   Anxiety    Exacerbated by pregnancy; also excessive by grandfather in ICU.   Headache    Family History  Problem Relation Age of Onset   Aneurysm Mother    Hypertension Father    Diabetes Father    Healthy Sister    Healthy Brother    Aneurysm Maternal Grandmother    Stroke Maternal Grandfather    Cancer Maternal Grandfather    Healthy Paternal Grandmother    Healthy Maternal Aunt    Social History   Socioeconomic History   Marital status: Single    Spouse name: Not on file   Number of children: Not on file   Years of education: Not on  file   Highest education level: High school graduate  Occupational History   Occupation: International aid/development worker  Tobacco Use   Smoking status: Never   Smokeless tobacco: Never  Vaping Use   Vaping Use: Former  Substance and Sexual Activity   Alcohol use: Not Currently   Drug use: Never   Sexual activity: Yes    Birth control/protection: None  Other Topics Concern   Not on file  Social History Narrative   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  Off-and-on living with her  boyfriend (who is the father of her unborn child).  She has significant anxiety about their relationship and about his ability to have maturity to be an active father.  She acknowledges that he has 34 year old boy and may not have the maturity to stick around.      She also has significant stress going on right now.  Her grandfather is in the ICU at [facility].  It sounds like he is probably being placed on comfort care measures.  She is quite sad and under lots of stress.   Social Determinants of Health   Financial Resource Strain: Not on file  Food Insecurity: Not on file  Transportation Needs: Not on file  Physical Activity: Not on file  Stress: Not on file  Social Connections: Not on file  Intimate Partner Violence: Not on file           Mardella Layman, MD 07/17/22 1342

## 2022-08-05 ENCOUNTER — Ambulatory Visit
Admission: EM | Admit: 2022-08-05 | Discharge: 2022-08-05 | Disposition: A | Discharge status: Home / Self Care | Payer: Medicaid Other | Attending: Nurse Practitioner | Admitting: Nurse Practitioner

## 2022-08-05 DIAGNOSIS — N926 Irregular menstruation, unspecified: Secondary | ICD-10-CM

## 2022-08-05 DIAGNOSIS — Z3201 Encounter for pregnancy test, result positive: Secondary | ICD-10-CM | POA: Diagnosis not present

## 2022-08-05 LAB — POCT URINE PREGNANCY: Preg Test, Ur: POSITIVE — AB

## 2022-08-05 NOTE — Discharge Instructions (Addendum)
Please establish with an obstetrician for prenatal care

## 2022-08-05 NOTE — ED Provider Notes (Signed)
[facility]    CSN: [id number] Arrival date & time: 08/05/22  1410      History   Chief Complaint Chief Complaint  Patient presents with   Possible Pregnancy    HPI Joy Dyer is a 21 y.o. female presents for pregnancy testing.  Patient reports last menstrual cycle was on 11/6.  She took 3 home pregnancy test that were positive.  She needs a documented pregnancy test to apply for health benefits.  She has no other concerns at this time.   Possible Pregnancy    Past Medical History:  Diagnosis Date   Anxiety    Exacerbated by pregnancy; also excessive by grandfather in ICU.   Headache     Patient Active Problem List   Diagnosis Date Noted   Irregular heartbeat 10/18/2021   Sinus tachycardia by electrocardiogram 08/31/2021   Near syncope 08/31/2021   Systolic murmur 08/31/2021    Past Surgical History:  Procedure Laterality Date   None      OB History     Gravida  2   Para  1   Term  1   Preterm      AB      Living  1      SAB      IAB      Ectopic      Multiple  0   Live Births  1            Home Medications    Prior to Admission medications   Medication Sig Start Date End Date Taking? Authorizing Provider  metroNIDAZOLE (FLAGYL) 500 MG tablet Take 1 tablet (500 mg total) by mouth 2 (two) times daily. 07/16/22   Lamptey, Britta Mccreedy, MD  metroNIDAZOLE (METROGEL VAGINAL) 0.75 % vaginal gel Place 1 Applicatorful vaginally at bedtime. 03/26/22   White, [name], NP  nitrofurantoin, macrocrystal-monohydrate, (MACROBID) 100 MG capsule Take 1 capsule (100 mg total) by mouth 2 (two) times daily. 03/29/22   Lamptey, Britta Mccreedy, MD    Family History Family History  Problem Relation Age of Onset   Aneurysm Mother    Hypertension Father    Diabetes Father    Healthy Sister    Healthy Brother    Aneurysm Maternal Grandmother    Stroke Maternal Grandfather    Cancer Maternal Grandfather    Healthy Paternal Grandmother     Healthy Maternal Aunt     Social History Social History   Tobacco Use   Smoking status: Never   Smokeless tobacco: Never  Vaping Use   Vaping Use: Former  Substance Use Topics   Alcohol use: Not Currently   Drug use: Never     Allergies   Patient has no known allergies.   Review of Systems Review of Systems  Genitourinary:  Positive for menstrual problem.     Physical Exam Triage Vital Signs ED Triage Vitals [08/05/22 1440]  Enc Vitals Group     BP 132/75     Pulse Rate 80     Resp 16     Temp 98.6 F (37 C)     Temp Source Oral     SpO2 98 %     Weight      Height      Head Circumference      Peak Flow      Pain Score 0     Pain Loc      Pain Edu?      Excl. in GC?  No data found.  Updated Vital Signs BP 132/75 (BP Location: Left Arm)   Pulse 80   Temp 98.6 F (37 C) (Oral)   Resp 16   LMP 06/25/2022   SpO2 98%   Breastfeeding No   Visual Acuity Right Eye Distance:   Left Eye Distance:   Bilateral Distance:    Right Eye Near:   Left Eye Near:    Bilateral Near:     Physical Exam Vitals and nursing note reviewed.  Constitutional:      Appearance: Normal appearance.  HENT:     Head: Normocephalic and atraumatic.  Eyes:     Pupils: Pupils are equal, round, and reactive to light.  Cardiovascular:     Rate and Rhythm: Normal rate.  Pulmonary:     Effort: Pulmonary effort is normal.  Skin:    General: Skin is warm and dry.  Neurological:     General: No focal deficit present.     Mental Status: She is alert and oriented to person, place, and time.  Psychiatric:        Mood and Affect: Mood normal.        Behavior: Behavior normal.      UC Treatments / Results  Labs (all labs ordered are listed, but only abnormal results are displayed) Labs Reviewed  POCT URINE PREGNANCY - Abnormal; Notable for the following components:      Result Value   Preg Test, Ur Positive (*)    All other components within normal limits     EKG   Radiology No results found.  Procedures Procedures (including critical care time)  Medications Ordered in UC Medications - No data to display  Initial Impression / Assessment and Plan / UC Course  I have reviewed the triage vital signs and the nursing notes.  Pertinent labs & imaging results that were available during my care of the patient were reviewed by me and considered in my medical decision making (see chart for details).     Patient informed of positive pregnancy test Encouraged to establish with obstetrician for prenatal care Follow-up with PCP as needed Final Clinical Impressions(s) / UC Diagnoses   Final diagnoses:  Positive urine pregnancy test  Missed period     Discharge Instructions      Please establish with an obstetrician for prenatal care   ED Prescriptions   None    PDMP not reviewed this encounter.   Radford Pax, NP 08/05/22 1455

## 2022-08-05 NOTE — ED Triage Notes (Signed)
Pt states that she needs pregnancy test to apply for pregnancy health benefits. States she did 3 at home tests and they are all positive.

## 2022-08-14 ENCOUNTER — Encounter: Payer: Self-pay | Admitting: Obstetrics & Gynecology

## 2022-08-14 ENCOUNTER — Other Ambulatory Visit: Payer: Self-pay

## 2022-08-14 ENCOUNTER — Inpatient Hospital Stay (HOSPITAL_COMMUNITY)
Admission: AD | Admit: 2022-08-14 | Discharge: 2022-08-14 | Disposition: A | Discharge status: Home / Self Care | Payer: Medicaid Other | Attending: Obstetrics & Gynecology | Admitting: Obstetrics & Gynecology

## 2022-08-14 ENCOUNTER — Inpatient Hospital Stay (HOSPITAL_COMMUNITY): Payer: Medicaid Other

## 2022-08-14 DIAGNOSIS — O209 Hemorrhage in early pregnancy, unspecified: Secondary | ICD-10-CM | POA: Diagnosis not present

## 2022-08-14 DIAGNOSIS — O26851 Spotting complicating pregnancy, first trimester: Secondary | ICD-10-CM | POA: Diagnosis not present

## 2022-08-14 DIAGNOSIS — Z6790 Unspecified blood type, Rh positive: Secondary | ICD-10-CM | POA: Diagnosis not present

## 2022-08-14 DIAGNOSIS — Z3491 Encounter for supervision of normal pregnancy, unspecified, first trimester: Secondary | ICD-10-CM

## 2022-08-14 DIAGNOSIS — Z3A01 Less than 8 weeks gestation of pregnancy: Secondary | ICD-10-CM | POA: Diagnosis not present

## 2022-08-14 DIAGNOSIS — O26891 Other specified pregnancy related conditions, first trimester: Secondary | ICD-10-CM | POA: Diagnosis not present

## 2022-08-14 DIAGNOSIS — R519 Headache, unspecified: Secondary | ICD-10-CM | POA: Insufficient documentation

## 2022-08-14 DIAGNOSIS — M549 Dorsalgia, unspecified: Secondary | ICD-10-CM | POA: Insufficient documentation

## 2022-08-14 DIAGNOSIS — R1030 Lower abdominal pain, unspecified: Secondary | ICD-10-CM | POA: Insufficient documentation

## 2022-08-14 LAB — CBC
HCT: 37.8 % (ref 36.0–46.0)
Hemoglobin: 13 g/dL (ref 12.0–15.0)
MCH: 30.3 pg (ref 26.0–34.0)
MCHC: 34.4 g/dL (ref 30.0–36.0)
MCV: 88.1 fL (ref 80.0–100.0)
Platelets: 309 10*3/uL (ref 150–400)
RBC: 4.29 MIL/uL (ref 3.87–5.11)
RDW: 12.4 % (ref 11.5–15.5)
WBC: 10 10*3/uL (ref 4.0–10.5)
nRBC: 0 % (ref 0.0–0.2)

## 2022-08-14 LAB — COMPREHENSIVE METABOLIC PANEL
ALT: 17 U/L (ref 0–44)
AST: 16 U/L (ref 15–41)
Albumin: 3.7 g/dL (ref 3.5–5.0)
Alkaline Phosphatase: 51 U/L (ref 38–126)
Anion gap: 6 (ref 5–15)
BUN: 6 mg/dL (ref 6–20)
CO2: 24 mmol/L (ref 22–32)
Calcium: 8.9 mg/dL (ref 8.9–10.3)
Chloride: 108 mmol/L (ref 98–111)
Creatinine, Ser: 0.68 mg/dL (ref 0.44–1.00)
GFR, Estimated: >60 mL/min (ref >60)
Glucose, Bld: 120 mg/dL — ABNORMAL HIGH (ref 70–99)
Potassium: 3.7 mmol/L (ref 3.5–5.1)
Sodium: 138 mmol/L (ref 135–145)
Total Bilirubin: 0.4 mg/dL (ref 0.3–1.2)
Total Protein: 6.4 g/dL — ABNORMAL LOW (ref 6.5–8.1)

## 2022-08-14 LAB — WET PREP, GENITAL
Clue Cells Wet Prep HPF POC: NONE SEEN
Sperm: NONE SEEN
Trich, Wet Prep: NONE SEEN
WBC, Wet Prep HPF POC: <10 (ref <10)
Yeast Wet Prep HPF POC: NONE SEEN

## 2022-08-14 LAB — URINALYSIS, ROUTINE W REFLEX MICROSCOPIC
Bilirubin Urine: NEGATIVE
Glucose, UA: NEGATIVE mg/dL
Hgb urine dipstick: NEGATIVE
Ketones, ur: NEGATIVE mg/dL
Leukocytes,Ua: NEGATIVE
Nitrite: NEGATIVE
Protein, ur: NEGATIVE mg/dL
Specific Gravity, Urine: 1.029 (ref 1.005–1.030)
pH: 5 (ref 5.0–8.0)

## 2022-08-14 LAB — HCG, QUANTITATIVE, PREGNANCY: hCG, Beta Chain, Quant, S: 47745 m[IU]/mL — ABNORMAL HIGH (ref <5)

## 2022-08-14 MED ORDER — PRENATAL 28-0.8 MG PO TABS: 1.0000 | ORAL_TABLET | Freq: Every day | ORAL | 12 refills | Status: DC

## 2022-08-14 MED ORDER — CYCLOBENZAPRINE HCL 10 MG PO TABS: 10.0000 mg | ORAL_TABLET | Freq: Three times a day (TID) | ORAL | 2 refills | Status: DC | PRN

## 2022-08-14 MED ORDER — ACETAMINOPHEN 500 MG PO TABS: 1000.0000 mg | ORAL_TABLET | Freq: Four times a day (QID) | ORAL | 2 refills | Status: DC | PRN

## 2022-08-14 NOTE — MAU Note (Signed)
Joy Dyer is a 21 y.o. at [redacted]w[redacted]d here in MAU reporting: she has a HA, back pain, lower abdominal pain, spotting, and N/V.  Reports she's had a HA for 3 days, but hasn't taken any meds to treat.  States back pain starts @ nape of neck and travels down entire back, reports hurts to sit.  Also c/o lower abdominal cramping with spotting. LMP: N/A Onset of complaint: 3 days Pain score: see pain scale Vitals:   08/14/22 1219  BP: 115/63  Pulse: (!) 112  Resp: 18  Temp: 99.1 F (37.3 C)  SpO2: 100%     FHT:NA Lab orders placed from triage:   UA

## 2022-08-14 NOTE — MAU Provider Note (Signed)
Faculty Practice OB/GYN Attending MAU Note  Chief Complaint: Vaginal Bleeding (In first trimester), Back Pain, Headache, and Abdominal Pain    Event Date/Time   First Provider Initiated Contact with Patient 08/14/22 1327      SUBJECTIVE Joy Dyer is a 21 y.o. G2P1001 at [redacted]w[redacted]d by LMP who presents with report of back pain, lower abdominal pain and headache for three days. Headache radiates to side of neck, no photophobia or neck rigidity.   Also reports vaginal spotting.  Feels like she is getting a cold. Has not taken anything for her symptoms.   Patient is already scheduled for her new OB appointment at [facility], she just delivered her last baby in April 2023.  Denies any abnormal vaginal discharge, fevers, chills, sweats, dysuria, nausea, vomiting, other GI or GU symptoms or other general symptoms.    Past Medical History:  Diagnosis Date   Anxiety    Exacerbated by pregnancy; also excessive by grandfather in ICU.   Headache    OB History  Gravida Para Term Preterm AB Living  2 1 1     1   SAB IAB Ectopic Multiple Live Births        0 1    # Outcome Date GA Lbr Len/2nd Weight Sex Delivery Anes PTL Lv  2 Current           1 Term 11/17/21 [redacted]w[redacted]d 18:00 / 01:23 3240 g F Vag-Spont EPI  LIV   Past Surgical History:  Procedure Laterality Date   None     Social History   Socioeconomic History   Marital status: Single    Spouse name: Not on file   Number of children: Not on file   Years of education: Not on file   Highest education level: High school graduate  Occupational History   Occupation: [redacted]w[redacted]d  Tobacco Use   Smoking status: Never   Smokeless tobacco: Never  Vaping Use   Vaping Use: Former  Substance and Sexual Activity   Alcohol use: Not Currently   Drug use: Never   Sexual activity: Yes    Birth control/protection: None  Other Topics Concern   Not on file  Social History Narrative   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  She has significant anxiety about their relationship and about his ability to have maturity to be an active father.  She acknowledges that he has 59 year old boy and may not have the maturity to stick around.      She also has significant stress going on right now.  Her grandfather is in the ICU at [facility].  It sounds like he is probably being placed on comfort care measures.  She is quite sad and under lots of stress.   Social Determinants of Health   Financial Resource Strain: Not on file  Food Insecurity: Not on file  Transportation Needs: Not on file  Physical Activity: Not on file  Stress: Not on file  Social Connections: Not on file  Intimate Partner Violence: Not on file   No current facility-administered medications on file prior to encounter.   No current outpatient medications on file prior to encounter.   No Known Allergies  ROS: Pertinent items in HPI  OBJECTIVE BP 115/63 (BP Location: Right Arm)   Pulse (!) 112   Temp 99.1 F (37.3 C) (Oral)   Resp 18   Ht 5' (1.524  m)   Wt 47.3 kg   LMP 06/24/2022   SpO2 100%   BMI 20.37 kg/m  CONSTITUTIONAL: Well-developed, well-nourished female in no acute distress.  HENT:  Normocephalic, atraumatic, External right and left ear normal. Oropharynx is clear and moist EYES: Conjunctivae and EOM are normal. Pupils are equal, round, and reactive to light. No scleral icterus.  NECK: Normal range of motion, supple, no masses.  Normal thyroid.  SKIN: Skin is warm and dry. No rash noted. Not diaphoretic. No erythema. No pallor. NEUROLGIC: Alert and oriented to person, place, and time. Normal reflexes, muscle tone coordination. No cranial nerve deficit noted. PSYCHIATRIC: Normal mood and affect. Normal behavior. Normal judgment and thought content. CARDIOVASCULAR: Normal heart rate noted RESPIRATORY: Effort and breath sounds normal, no problems  with respiration noted. ABDOMEN: Soft, normal bowel sounds, no distention noted.  No tenderness, rebound or guarding.  BACK: No CVAT PELVIC: Deferred MUSCULOSKELETAL: Normal range of motion. No tenderness.  No cyanosis, clubbing, or edema.  2+ distal pulses.  LAB RESULTS Results for orders placed or performed during the hospital encounter of 08/14/22 (from the past 48 hour(s))  CBC     Status: None   Collection Time: 08/14/22 11:39 AM  Result Value Ref Range   WBC 10.0 4.0 - 10.5 K/uL   RBC 4.29 3.87 - 5.11 MIL/uL   Hemoglobin 13.0 12.0 - 15.0 g/dL   HCT 16.1 09.6 - 04.5 %   MCV 88.1 80.0 - 100.0 fL   MCH 30.3 26.0 - 34.0 pg   MCHC 34.4 30.0 - 36.0 g/dL   RDW 40.9 81.1 - 91.4 %   Platelets 309 150 - 400 K/uL   nRBC 0.0 0.0 - 0.2 %    Comment: Performed at [facility], [address] [place], [place] [address]  Comprehensive metabolic panel     Status: Abnormal   Collection Time: 08/14/22 11:39 AM  Result Value Ref Range   Sodium 138 135 - 145 mmol/L   Potassium 3.7 3.5 - 5.1 mmol/L   Chloride 108 98 - 111 mmol/L   CO2 24 22 - 32 mmol/L   Glucose, Bld 120 (H) 70 - 99 mg/dL    Comment: Glucose reference range applies only to samples taken after fasting for at least 8 hours.   BUN 6 6 - 20 mg/dL   Creatinine, Ser 6.21 0.44 - 1.00 mg/dL   Calcium 8.9 8.9 - 30.8 mg/dL   Total Protein 6.4 (L) 6.5 - 8.1 g/dL   Albumin 3.7 3.5 - 5.0 g/dL   AST 16 15 - 41 U/L   ALT 17 0 - 44 U/L   Alkaline Phosphatase 51 38 - 126 U/L   Total Bilirubin 0.4 0.3 - 1.2 mg/dL   GFR, Estimated >65 >78 mL/min    Comment: (NOTE) Calculated using the CKD-EPI Creatinine Equation (2021)    Anion gap 6 5 - 15    Comment: Performed at [facility], [address], [place], [place] [address]  hCG, quantitative, pregnancy     Status: Abnormal   Collection Time: 08/14/22 11:39 AM  Result Value Ref Range   hCG, Beta Chain, Quant, S 47,745 (H) <5 mIU/mL    Comment:          GEST. AGE      CONC.   (mIU/mL)   <=1 WEEK        5 - 50     2 WEEKS       50 -  500     3 WEEKS       100 - 10,000     4 WEEKS     1,000 - 30,000     5 WEEKS     3,500 - 115,000   6-8 WEEKS     12,000 - 270,000    12 WEEKS     15,000 - 220,000        FEMALE AND NON-PREGNANT FEMALE:     LESS THAN 5 mIU/mL Performed at [facility], [address] [place], [place] [address]   Wet prep, genital     Status: None   Collection Time: 08/14/22 11:51 AM   Specimen: PATH Cytology Cervicovaginal Ancillary Only  Result Value Ref Range   Yeast Wet Prep HPF POC NONE SEEN NONE SEEN   Trich, Wet Prep NONE SEEN NONE SEEN   Clue Cells Wet Prep HPF POC NONE SEEN NONE SEEN   WBC, Wet Prep HPF POC <10 <10   Sperm NONE SEEN     Comment: Performed at [facility], [address] [place], [place] [address]  Urinalysis, Routine w reflex microscopic     Status: Abnormal   Collection Time: 08/14/22 11:51 AM  Result Value Ref Range   Color, Urine YELLOW YELLOW   APPearance HAZY (A) CLEAR   Specific Gravity, Urine 1.029 1.005 - 1.030   pH 5.0 5.0 - 8.0   Glucose, UA NEGATIVE NEGATIVE mg/dL   Hgb urine dipstick NEGATIVE NEGATIVE   Bilirubin Urine NEGATIVE NEGATIVE   Ketones, ur NEGATIVE NEGATIVE mg/dL   Protein, ur NEGATIVE NEGATIVE mg/dL   Nitrite NEGATIVE NEGATIVE   Leukocytes,Ua NEGATIVE NEGATIVE    Comment: Performed at [facility], [address] [place], [place] [address]    IMAGING US [place] Less 14 Wks  Result Date: 08/14/2022 CLINICAL DATA:  Vaginal bleeding in 1st trimester pregnancy. Cramping pain and nausea and vomiting. EXAM: OBSTETRIC <14 WK ULTRASOUND TECHNIQUE: Transabdominal ultrasound was performed for evaluation of the gestation as well as the maternal uterus and adnexal regions. COMPARISON:  None Available. FINDINGS: Intrauterine gestational sac: Single Yolk sac:  Visualized. Embryo:  Visualized. Cardiac Activity: Visualized. Heart Rate: 135 bpm CRL:   6 mm   6 w 3 d                   US EDC: 04/06/2023 Subchorionic hemorrhage:  None visualized. Maternal uterus/adnexae: Normal appearance of left ovary. Right ovary is not directly visualized, however no mass or abnormal free fluid identified. IMPRESSION: Single living IUP with estimated gestational age of [redacted] weeks 3 days, and US EDC of 04/06/2023. No maternal uterine or adnexal abnormality identified. Electronically Signed   By: Danae OrleansJohn A Stahl M.D.   On: 08/14/2022 13:22    MAU COURSE Labs and ultrasound done. Patient has B pos blood type  ASSESSMENT 1. Vaginal bleeding in pregnancy, first trimester   2. Intrauterine pregnancy in first trimester   3. Blood type, Rh positive   4. [redacted] weeks gestation of pregnancy     PLAN Patient is likely getting a cold, no current fevers or respiratory distress.  Talked about OTC medications that will help with her symptoms. Flexeril and Tylenol also prescribed for her symptoms as needed. Prenatal vitamins prescribed. Reassured about normal IUP, bleeding precautions reviewed. Follow up in ED as needed for emergent indications or for prenatal care as scheduled. Discharge home  Allergies as of 08/14/2022   No Known Allergies  Medication List     STOP taking these medications    metroNIDAZOLE 0.75 % vaginal gel Commonly known as: METROGEL VAGINAL   metroNIDAZOLE 500 MG tablet Commonly known as: FLAGYL   nitrofurantoin (macrocrystal-monohydrate) 100 MG capsule Commonly known as: MACROBID       TAKE these medications    acetaminophen 500 MG tablet Commonly known as: TYLENOL Take 2 tablets (1,000 mg total) by mouth every 6 (six) hours as needed for moderate pain, fever or headache.   cyclobenzaprine 10 MG tablet Commonly known as: FLEXERIL Take 1 tablet (10 mg total) by mouth 3 (three) times daily as needed for muscle spasms.   Prenatal 28-0.8 MG Tabs Take 1 tablet by mouth daily.        Evaluation does not show pathology that would require ongoing emergent  intervention or inpatient treatment. Patient is hemodynamically stable and mentating appropriately. Discussed findings and plan with patient, who agrees with care plan. All questions answered. Return precautions discussed and outpatient follow up recommendations given.  Tereso Newcomer, MD 08/14/2022 3:33 PM

## 2022-08-15 LAB — GC/CHLAMYDIA PROBE AMP (~~LOC~~) NOT AT ARMC
Chlamydia: NEGATIVE
Comment: NEGATIVE
Comment: NORMAL
Neisseria Gonorrhea: NEGATIVE

## 2022-08-19 NOTE — L&D Delivery Note (Signed)
OB/GYN Faculty Practice Delivery Note  Joy Dyer is a 22 y.o. G2P1001 s/p VD at [redacted]w[redacted]d. She was admitted for SROM.   ROM: 12h 4m with clear, bloody fluid GBS Status:  Negative/-- (07/16 1011) Maximum Maternal Temperature: 98.3  Labor Progress: Initial SVE: 3.5/40,50/-2. She then progressed to complete.   Delivery Date/Time: 03/24/2023 @2059  Delivery: Called to room and patient was complete and pushing. Head delivered ROA. No nuchal cord present. Shoulder and body delivered in usual fashion. Infant with spontaneous cry, placed on mother's abdomen, dried and stimulated. Cord clamped x 2 after 1-minute delay, and cut by FOB. Cord blood drawn. Placenta delivered spontaneously with gentle cord traction. Fundus firm with massage and Pitocin. Labia, perineum, vagina, and cervix inspected. There were no lacerations.   Baby Weight: pending  Placenta: 3 vessel, intact. Sent to L&D Complications: None Lacerations: None EBL: 78 mL Analgesia: Epidural   Infant:  APGAR (1 MIN): 9  APGAR (5 MINS): 9   Gayla Benn Autry-Lott, DO OB Fellow, Faculty [facility] [employer] 03/24/2023, 9:24 PM

## 2022-09-17 ENCOUNTER — Other Ambulatory Visit (HOSPITAL_COMMUNITY)
Admission: RE | Admit: 2022-09-17 | Discharge: 2022-09-17 | Disposition: A | Discharge status: Home / Self Care | Payer: Medicaid Other | Source: Ambulatory Visit | Attending: Obstetrics and Gynecology | Admitting: Obstetrics and Gynecology

## 2022-09-17 ENCOUNTER — Ambulatory Visit (INDEPENDENT_AMBULATORY_CARE_PROVIDER_SITE_OTHER): Payer: Medicaid Other | Admitting: Obstetrics and Gynecology

## 2022-09-17 ENCOUNTER — Encounter: Payer: Self-pay | Admitting: Obstetrics and Gynecology

## 2022-09-17 VITALS — BP 121/80 | HR 78 | Wt 107.0 lb

## 2022-09-17 DIAGNOSIS — R011 Cardiac murmur, unspecified: Secondary | ICD-10-CM | POA: Insufficient documentation

## 2022-09-17 DIAGNOSIS — O09899 Supervision of other high risk pregnancies, unspecified trimester: Secondary | ICD-10-CM | POA: Insufficient documentation

## 2022-09-17 DIAGNOSIS — Z3A12 12 weeks gestation of pregnancy: Secondary | ICD-10-CM

## 2022-09-17 DIAGNOSIS — I499 Cardiac arrhythmia, unspecified: Secondary | ICD-10-CM | POA: Diagnosis not present

## 2022-09-17 DIAGNOSIS — Z3481 Encounter for supervision of other normal pregnancy, first trimester: Secondary | ICD-10-CM

## 2022-09-17 DIAGNOSIS — Z3480 Encounter for supervision of other normal pregnancy, unspecified trimester: Secondary | ICD-10-CM

## 2022-09-17 DIAGNOSIS — O09891 Supervision of other high risk pregnancies, first trimester: Secondary | ICD-10-CM | POA: Insufficient documentation

## 2022-09-17 DIAGNOSIS — Z3490 Encounter for supervision of normal pregnancy, unspecified, unspecified trimester: Secondary | ICD-10-CM | POA: Insufficient documentation

## 2022-09-17 NOTE — Progress Notes (Signed)
New OB Note  09/17/2022   Clinic: Center for [facility]  Chief Complaint: new OB  Transfer of Care Patient: no  History of Present Illness: Joy Dyer is a 22 y.o. G2P1001 @ 12/1 weeks ([place] 8/12, based on Patient's last menstrual period was 06/24/2022.=6wk u/s).  Preg complicated by has Near syncope; Irregular heartbeat; Encounter for supervision of normal pregnancy, antepartum; and Short interval between pregnancies affecting pregnancy, antepartum on their problem list.   Patient denies any OB s/s or morning sickness s/s.   ROS: A 12-point review of systems was performed and negative, except as stated in the above HPI.  OBGYN History: As per HPI. OB History  Gravida Para Term Preterm AB Living  2 1 1     1   SAB IAB Ectopic Multiple Live Births        0 1    # Outcome Date GA Lbr Len/2nd Weight Sex Delivery Anes PTL Lv  2 Current           1 Term 11/17/21 [redacted]w[redacted]d 18:00 / 01:23 7 lb 2.3 oz (3.24 kg) F Vag-Spont EPI  LIV   Any issues with any prior pregnancies: IOL for pain Prior children are healthy, doing well, and without any problems or issues: yes History of pap smears: No.   Past Medical History: Past Medical History:  Diagnosis Date   Anxiety    Exacerbated by pregnancy; also excessive by grandfather in ICU.   Headache    Systolic murmur 42/68/3419   Normal pregnancy echo 2023. Likely flow murmur    Past Surgical History: Past Surgical History:  Procedure Laterality Date   None      Family History:  Family History  Problem Relation Age of Onset   Aneurysm Mother    Hypertension Father    Diabetes Father    Healthy Sister    Healthy Brother    Aneurysm Maternal Grandmother    Stroke Maternal Grandfather    Cancer Maternal Grandfather    Healthy Paternal Grandmother    Healthy Maternal Aunt    Social History:  Social History   Socioeconomic History   Marital status: Single    Spouse name: Not on file   Number of children: Not on  file   Years of education: Not on file   Highest education level: High school graduate  Occupational History   Occupation: Copy  Tobacco Use   Smoking status: Never   Smokeless tobacco: Never  Vaping Use   Vaping Use: Former  Substance and Sexual Activity   Alcohol use: Not Currently   Drug use: Never   Sexual activity: Yes    Birth control/protection: None  Other Topics Concern   Not on file  Social History Narrative   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.  Off-and-on living with her boyfriend (who is the father of her unborn child).  She has significant anxiety about their relationship and about his ability to have maturity to be an active father.  She acknowledges that he has 74 year old boy and may not have the maturity to stick around.      She also has significant stress going on right now.  Her grandfather is in the ICU at [facility].  It sounds like he is probably being placed on comfort care measures.  She is quite sad and under lots of stress.   Social Determinants of Health   Financial Resource Strain: Not on file  Food Insecurity: Not on file  Transportation Needs: Not on file  Physical Activity: Not on file  Stress: Not on file  Social Connections: Not on file  Intimate Partner Violence: Not on file    Allergy: No Known Allergies   Current Outpatient Medications: Prenatal   Physical Exam:   BP 121/80   Pulse 78   Wt 107 lb (48.5 kg)   LMP 06/24/2022   BMI 20.90 kg/m  Body mass index is 20.9 kg/m. Contractions: Not present Vag. Bleeding: None. Fundal height: not applicable FHTs: 098J  General appearance: Well nourished, well developed female in no acute distress.  Neck:  Supple, normal appearance, and no thyromegaly  Cardiovascular: S1, S2 normal, no murmur, rub or gallop, regular rate and rhythm Respiratory:  Clear to auscultation bilateral. Normal respiratory effort Abdomen: positive bowel sounds and no masses,  hernias; diffusely non tender to palpation, non distended Breasts: patient denies any breast s/s. Neuro/Psych:  Normal mood and affect.  Skin:  Warm and dry.  Lymphatic:  No inguinal lymphadenopathy.   Pelvic exam: is not limited by body habitus EGBUS: within normal limits, Vagina: within normal limits and with no blood in the vault, Cervix: normal appearing cervix without discharge or lesions, closed/long/high, Uterus:  enlarged, c/w 12-14 week size, and Adnexa:  normal adnexa and no mass, fullness, tenderness  Laboratory: none  Imaging:  No new imaging  Assessment: patient doing well  Plan: 1. Supervision of other normal pregnancy, antepartum Offer afp next visit. Declines genetics - Culture, OB Urine - Cytology - PAP( [place]) - Cervicovaginal ancillary only( [place]) - CBC/D/Plt+RPR+Rh+ABO+RubIgG... - [place] MFM OB COMP + 14 WK; Future  2. Short interval between pregnancies affecting pregnancy, antepartum  Problem list reviewed and updated.  Follow up in 4 weeks.   >50% of 35 min visit spent on counseling and coordination of care.     Durene Romans MD Attending Center for [facility])

## 2022-09-17 NOTE — Progress Notes (Signed)
Pt presents for NOB visit reports vaginal discharge and vulva itching.  She requests to defer Panorama until next visit.

## 2022-09-18 LAB — CBC/D/PLT+RPR+RH+ABO+RUBIGG...
Antibody Screen: NEGATIVE
Basophils Absolute: 0 10*3/uL (ref 0.0–0.2)
Basos: 0 %
EOS (ABSOLUTE): 0.1 10*3/uL (ref 0.0–0.4)
Eos: 1 %
HCV Ab: NONREACTIVE
HIV Screen 4th Generation wRfx: NONREACTIVE
Hematocrit: 41.4 % (ref 34.0–46.6)
Hemoglobin: 13.7 g/dL (ref 11.1–15.9)
Hepatitis B Surface Ag: NEGATIVE
Immature Grans (Abs): 0 10*3/uL (ref 0.0–0.1)
Immature Granulocytes: 0 %
Lymphocytes Absolute: 2.4 10*3/uL (ref 0.7–3.1)
Lymphs: 17 %
MCH: 29.5 pg (ref 26.6–33.0)
MCHC: 33.1 g/dL (ref 31.5–35.7)
MCV: 89 fL (ref 79–97)
Monocytes Absolute: 0.5 10*3/uL (ref 0.1–0.9)
Monocytes: 4 %
Neutrophils Absolute: 10.8 10*3/uL — ABNORMAL HIGH (ref 1.4–7.0)
Neutrophils: 78 %
Platelets: 369 10*3/uL (ref 150–450)
RBC: 4.65 x10E6/uL (ref 3.77–5.28)
RDW: 12.9 % (ref 11.7–15.4)
RPR Ser Ql: NONREACTIVE
Rh Factor: POSITIVE
Rubella Antibodies, IGG: 4.58 index (ref >0.99)
WBC: 13.9 10*3/uL — ABNORMAL HIGH (ref 3.4–10.8)

## 2022-09-18 LAB — HCV INTERPRETATION

## 2022-09-19 DIAGNOSIS — Z419 Encounter for procedure for purposes other than remedying health state, unspecified: Secondary | ICD-10-CM | POA: Diagnosis not present

## 2022-09-19 LAB — CERVICOVAGINAL ANCILLARY ONLY
Bacterial Vaginitis (gardnerella): NEGATIVE
Candida Glabrata: NEGATIVE
Candida Vaginitis: POSITIVE — AB
Chlamydia: NEGATIVE
Comment: NEGATIVE
Comment: NEGATIVE
Comment: NEGATIVE
Comment: NEGATIVE
Comment: NEGATIVE
Comment: NORMAL
Neisseria Gonorrhea: NEGATIVE
Trichomonas: NEGATIVE

## 2022-09-19 LAB — CULTURE, OB URINE

## 2022-09-19 LAB — URINE CULTURE, OB REFLEX: Organism ID, Bacteria: NO GROWTH

## 2022-09-20 LAB — CYTOLOGY - PAP
Adequacy: ABSENT
Diagnosis: NEGATIVE

## 2022-10-15 ENCOUNTER — Ambulatory Visit (INDEPENDENT_AMBULATORY_CARE_PROVIDER_SITE_OTHER): Payer: Medicaid Other | Admitting: Obstetrics and Gynecology

## 2022-10-15 VITALS — BP 121/72 | HR 84 | Wt 110.0 lb

## 2022-10-15 DIAGNOSIS — Z3492 Encounter for supervision of normal pregnancy, unspecified, second trimester: Secondary | ICD-10-CM | POA: Diagnosis not present

## 2022-10-15 DIAGNOSIS — R55 Syncope and collapse: Secondary | ICD-10-CM

## 2022-10-15 DIAGNOSIS — Z3482 Encounter for supervision of other normal pregnancy, second trimester: Secondary | ICD-10-CM | POA: Diagnosis not present

## 2022-10-15 DIAGNOSIS — Z3A16 16 weeks gestation of pregnancy: Secondary | ICD-10-CM

## 2022-10-15 NOTE — Progress Notes (Unsigned)
CC: Denies any concerns  Requesting panorama today

## 2022-10-16 NOTE — Progress Notes (Signed)
   PRENATAL VISIT NOTE  Subjective:  Joy Dyer is a 22 y.o. G2P1001 at 22w1dbeing seen today for ongoing prenatal care.  She is currently monitored for the following issues for this low-risk pregnancy and has Near syncope; Irregular heartbeat; Encounter for supervision of normal pregnancy, antepartum; and Short interval between pregnancies affecting pregnancy, antepartum on their problem list.  Patient reports no complaints.  Contractions: Not present. Vag. Bleeding: None.  Movement: Present. Denies leaking of fluid.   The following portions of the patient's history were reviewed and updated as appropriate: allergies, current medications, past family history, past medical history, past social history, past surgical history and problem list.   Objective:   Vitals:   10/15/22 1616  BP: 121/72  Pulse: 84  Weight: 110 lb (49.9 kg)    Fetal Status: Fetal Heart Rate (bpm): 141   Movement: Present     General:  Alert, oriented and cooperative. Patient is in no acute distress.  Skin: Skin is warm and dry. No rash noted.   Cardiovascular: Normal heart rate noted  Respiratory: Normal respiratory effort, no problems with respiration noted  Abdomen: Soft, gravid, appropriate for gestational age.  Pain/Pressure: Absent     Pelvic: Cervical exam deferred        Extremities: Normal range of motion.  Edema: None  Mental Status: Normal mood and affect. Normal behavior. Normal judgment and thought content.   Assessment and Plan:  Pregnancy: G2P1001 at 150w1d. [redacted] weeks gestation of pregnancy Routine care. Desires genetics today. Panorama and afp done - AFP, Serum, Open Spina Bifida  Preterm labor symptoms and general obstetric precautions including but not limited to vaginal bleeding, contractions, leaking of fluid and fetal movement were reviewed in detail with the patient. Please refer to After Visit Summary for other counseling recommendations.   Return in about 1 month (around 11/13/2022)  for low risk ob, in person, md or app.  Future Appointments  Date Time Provider [place]3/26/2024  1:30 PM AnOsborne OmanMD [facility]  11/22/2022  1:45 PM [facility]/26/2024 10:35 AM NeErnestina PatchesKiJuanita CraverMD [facility]    ChAletha HalimMD

## 2022-10-17 LAB — AFP, SERUM, OPEN SPINA BIFIDA
AFP MoM: 0.75
AFP Value: 34.3 ng/mL
Gest. Age on Collection Date: 16 weeks
Maternal Age At EDD: 21.5 yr
OSBR Risk 1 IN: 10000
Test Results:: NEGATIVE
Weight: 110 [lb_av]

## 2022-10-18 DIAGNOSIS — Z419 Encounter for procedure for purposes other than remedying health state, unspecified: Secondary | ICD-10-CM | POA: Diagnosis not present

## 2022-10-22 LAB — PANORAMA PRENATAL TEST FULL PANEL:PANORAMA TEST PLUS 5 ADDITIONAL MICRODELETIONS: FETAL FRACTION: 10.3

## 2022-11-06 ENCOUNTER — Encounter: Payer: Self-pay | Admitting: Obstetrics & Gynecology

## 2022-11-12 ENCOUNTER — Ambulatory Visit: Payer: Medicaid Other | Admitting: Obstetrics & Gynecology

## 2022-11-12 ENCOUNTER — Encounter: Payer: Self-pay | Admitting: Obstetrics & Gynecology

## 2022-11-12 VITALS — BP 118/76 | HR 78 | Wt 116.0 lb

## 2022-11-12 DIAGNOSIS — O26892 Other specified pregnancy related conditions, second trimester: Secondary | ICD-10-CM

## 2022-11-12 DIAGNOSIS — R519 Headache, unspecified: Secondary | ICD-10-CM

## 2022-11-12 DIAGNOSIS — Z3480 Encounter for supervision of other normal pregnancy, unspecified trimester: Secondary | ICD-10-CM

## 2022-11-12 DIAGNOSIS — Z3A20 20 weeks gestation of pregnancy: Secondary | ICD-10-CM

## 2022-11-12 MED ORDER — MAGNESIUM OXIDE -MG SUPPLEMENT 200 MG PO TABS: 1.0000 | ORAL_TABLET | Freq: Two times a day (BID) | ORAL | 2 refills | Status: DC

## 2022-11-12 MED ORDER — CYCLOBENZAPRINE HCL 10 MG PO TABS: 10.0000 mg | ORAL_TABLET | Freq: Three times a day (TID) | ORAL | 2 refills | Status: DC | PRN

## 2022-11-12 MED ORDER — ACETAMINOPHEN-CAFFEINE 500-65 MG PO TABS: 2.0000 | ORAL_TABLET | Freq: Four times a day (QID) | ORAL | 1 refills | Status: DC | PRN

## 2022-11-12 NOTE — Progress Notes (Signed)
   PRENATAL VISIT NOTE  Subjective:  Joy Dyer is a 22 y.o. G2P1001 at [redacted]w[redacted]d being seen today for ongoing prenatal care.  She is currently monitored for the following issues for this low-risk pregnancy and has Near syncope; Irregular heartbeat; Encounter for supervision of normal pregnancy, antepartum; and Short interval between pregnancies affecting pregnancy, antepartum on their problem list.  Patient reports headaches not alleviated by Tylenol. History of migraines, wants to discuss other management.  Contractions: Not present. Vag. Bleeding: None.  Movement: Present. Denies leaking of fluid.   The following portions of the patient's history were reviewed and updated as appropriate: allergies, current medications, past family history, past medical history, past social history, past surgical history and problem list.   Objective:   Vitals:   11/12/22 1336  BP: 118/76  Pulse: 78  Weight: 116 lb (52.6 kg)    Fetal Status: Fetal Heart Rate (bpm): 152   Movement: Present     General:  Alert, oriented and cooperative. Patient is in no acute distress.  Skin: Skin is warm and dry. No rash noted.   Cardiovascular: Normal heart rate noted  Respiratory: Normal respiratory effort, no problems with respiration noted  Abdomen: Soft, gravid, appropriate for gestational age.  Pain/Pressure: Present     Pelvic: Cervical exam deferred        Extremities: Normal range of motion.  Edema: None  Mental Status: Normal mood and affect. Normal behavior. Normal judgment and thought content.    Assessment and Plan:  Pregnancy: G2P1001 at [redacted]w[redacted]d 1. Headache in pregnancy, antepartum, second trimester Discussed management, will try Tylenol + caffeine, also add Magnesium oxide and Flexeril. If no effect, consider tryptan medication/headache specialist here at [facility]. Adequate hydration recommended. - Magnesium Oxide -Mg Supplement 200 MG TABS; Take 1 tablet (200 mg total) by mouth 2 (two) times daily. As needed  for headaches  Dispense: 30 tablet; Refill: 2 - cyclobenzaprine (FLEXERIL) 10 MG tablet; Take 1 tablet (10 mg total) by mouth 3 (three) times daily as needed for muscle spasms (headaches).  Dispense: 30 tablet; Refill: 2 - acetaminophen-caffeine (EXCEDRIN TENSION HEADACHE) 500-65 MG TABS per tablet; Take 2 tablets by mouth every 6 (six) hours as needed (migraine).  Dispense: 300 tablet; Refill: 1  2. [redacted] weeks gestation of pregnancy 3. Supervision of other normal pregnancy, antepartum LR NIPS, AFP neg. Already scheduled for anatomy scan. Discussed LARCs for postpartum contraception, leaning towards IUD. Preterm labor symptoms and general obstetric precautions including but not limited to vaginal bleeding, contractions, leaking of fluid and fetal movement were reviewed in detail with the patient. Please refer to After Visit Summary for other counseling recommendations.   Return in about 4 weeks (around 12/10/2022) for OFFICE OB VISIT (MD or APP).  Future Appointments  Date Time Provider [place]  11/22/2022  1:45 PM [facility] US5 [facility]  12/13/2022 10:35 AM Joy Jude, MD [facility]  01/07/2023  8:30 AM [facility] LAB [facility]  01/07/2023  8:55 AM Dyer, Joy Chafe, MD [facility]    Joy Schneiders, MD

## 2022-11-18 DIAGNOSIS — Z419 Encounter for procedure for purposes other than remedying health state, unspecified: Secondary | ICD-10-CM | POA: Diagnosis not present

## 2022-11-22 ENCOUNTER — Other Ambulatory Visit: Payer: Self-pay | Admitting: *Deleted

## 2022-11-22 ENCOUNTER — Ambulatory Visit: Payer: Medicaid Other | Attending: Obstetrics and Gynecology

## 2022-11-22 DIAGNOSIS — Z3689 Encounter for other specified antenatal screening: Secondary | ICD-10-CM | POA: Diagnosis not present

## 2022-11-22 DIAGNOSIS — Z3A21 21 weeks gestation of pregnancy: Secondary | ICD-10-CM | POA: Insufficient documentation

## 2022-11-22 DIAGNOSIS — Z362 Encounter for other antenatal screening follow-up: Secondary | ICD-10-CM

## 2022-11-22 DIAGNOSIS — Z3480 Encounter for supervision of other normal pregnancy, unspecified trimester: Secondary | ICD-10-CM | POA: Insufficient documentation

## 2022-11-22 DIAGNOSIS — Z363 Encounter for antenatal screening for malformations: Secondary | ICD-10-CM | POA: Insufficient documentation

## 2022-11-22 DIAGNOSIS — O09892 Supervision of other high risk pregnancies, second trimester: Secondary | ICD-10-CM | POA: Diagnosis not present

## 2022-12-13 ENCOUNTER — Ambulatory Visit (INDEPENDENT_AMBULATORY_CARE_PROVIDER_SITE_OTHER): Payer: Medicaid Other | Admitting: Family Medicine

## 2022-12-13 VITALS — BP 107/65 | HR 78 | Wt 120.0 lb

## 2022-12-13 DIAGNOSIS — O09899 Supervision of other high risk pregnancies, unspecified trimester: Secondary | ICD-10-CM

## 2022-12-13 DIAGNOSIS — Z3480 Encounter for supervision of other normal pregnancy, unspecified trimester: Secondary | ICD-10-CM

## 2022-12-13 NOTE — Progress Notes (Signed)
ROB

## 2022-12-13 NOTE — Progress Notes (Signed)
   PRENATAL VISIT NOTE  Subjective:  Joy Dyer is a 22 y.o. G2P1001 at [redacted]w[redacted]d being seen today for ongoing prenatal care.  She is currently monitored for the following issues for this low-risk pregnancy and has Near syncope; Irregular heartbeat; Encounter for supervision of normal pregnancy, antepartum; and Short interval between pregnancies affecting pregnancy, antepartum on their problem list.  Patient reports no complaints.  Contractions: Irritability. Vag. Bleeding: None.  Movement: Present. Denies leaking of fluid.   The following portions of the patient's history were reviewed and updated as appropriate: allergies, current medications, past family history, past medical history, past social history, past surgical history and problem list.   Objective:   Vitals:   12/13/22 1051  BP: 107/65  Pulse: 78  Weight: 120 lb (54.4 kg)    Fetal Status: Fetal Heart Rate (bpm): 142 Fundal Height: 23 cm Movement: Present     General:  Alert, oriented and cooperative. Patient is in no acute distress.  Skin: Skin is warm and dry. No rash noted.   Cardiovascular: Normal heart rate noted  Respiratory: Normal respiratory effort, no problems with respiration noted  Abdomen: Soft, gravid, appropriate for gestational age.  Pain/Pressure: Present     Pelvic: Cervical exam deferred        Extremities: Normal range of motion.  Edema: None  Mental Status: Normal mood and affect. Normal behavior. Normal judgment and thought content.   Assessment and Plan:  Pregnancy: G2P1001 at [redacted]w[redacted]d 1. Supervision of other normal pregnancy, antepartum Continue routine prenatal care.  2. Short interval between pregnancies affecting pregnancy, antepartum   Preterm labor symptoms and general obstetric precautions including but not limited to vaginal bleeding, contractions, leaking of fluid and fetal movement were reviewed in detail with the patient. Please refer to After Visit Summary for other counseling  recommendations.   Return in 4 weeks (on 01/10/2023) for [facility], 28 wk labs.  Future Appointments  Date Time Provider Department Center  01/03/2023  2:45 PM [facility] NURSE [facility]  01/03/2023  3:00 PM [facility] US1 [facility]  01/07/2023  8:30 AM [facility] LAB [facility]  01/07/2023  8:55 AM Constant, Gigi Gin, MD [facility]  01/21/2023  8:55 AM Anyanwu, Jethro Bastos, MD [facility]  02/04/2023  8:55 AM Milas Hock, MD [facility]    [name], MD

## 2022-12-18 DIAGNOSIS — Z419 Encounter for procedure for purposes other than remedying health state, unspecified: Secondary | ICD-10-CM | POA: Diagnosis not present

## 2022-12-30 ENCOUNTER — Encounter: Payer: Self-pay | Admitting: Family Medicine

## 2023-01-02 ENCOUNTER — Encounter: Payer: Self-pay | Admitting: *Deleted

## 2023-01-03 ENCOUNTER — Ambulatory Visit: Payer: Medicaid Other | Attending: Obstetrics and Gynecology

## 2023-01-03 ENCOUNTER — Ambulatory Visit: Payer: Medicaid Other | Admitting: *Deleted

## 2023-01-03 VITALS — BP 111/61 | HR 83

## 2023-01-03 DIAGNOSIS — O09892 Supervision of other high risk pregnancies, second trimester: Secondary | ICD-10-CM | POA: Insufficient documentation

## 2023-01-03 DIAGNOSIS — Z3A26 26 weeks gestation of pregnancy: Secondary | ICD-10-CM | POA: Diagnosis not present

## 2023-01-03 DIAGNOSIS — O09292 Supervision of pregnancy with other poor reproductive or obstetric history, second trimester: Secondary | ICD-10-CM

## 2023-01-03 DIAGNOSIS — Z362 Encounter for other antenatal screening follow-up: Secondary | ICD-10-CM

## 2023-01-03 DIAGNOSIS — Z3689 Encounter for other specified antenatal screening: Secondary | ICD-10-CM | POA: Diagnosis not present

## 2023-01-07 ENCOUNTER — Ambulatory Visit (INDEPENDENT_AMBULATORY_CARE_PROVIDER_SITE_OTHER): Payer: Medicaid Other | Admitting: Obstetrics and Gynecology

## 2023-01-07 ENCOUNTER — Encounter: Payer: Self-pay | Admitting: Obstetrics and Gynecology

## 2023-01-07 ENCOUNTER — Other Ambulatory Visit: Payer: Medicaid Other

## 2023-01-07 VITALS — BP 109/73 | HR 80 | Wt 125.8 lb

## 2023-01-07 DIAGNOSIS — O09899 Supervision of other high risk pregnancies, unspecified trimester: Secondary | ICD-10-CM

## 2023-01-07 DIAGNOSIS — Z3480 Encounter for supervision of other normal pregnancy, unspecified trimester: Secondary | ICD-10-CM

## 2023-01-07 DIAGNOSIS — Z3A27 27 weeks gestation of pregnancy: Secondary | ICD-10-CM

## 2023-01-07 NOTE — Progress Notes (Signed)
CC: ROB   Pt stating that she has noticed swelling in her feet randomly

## 2023-01-07 NOTE — Progress Notes (Signed)
   PRENATAL VISIT NOTE  Subjective:  Joy Dyer is a 22 y.o. G2P1001 at [redacted]w[redacted]d being seen today for ongoing prenatal care.  She is currently monitored for the following issues for this low-risk pregnancy and has Encounter for supervision of normal pregnancy, antepartum and Short interval between pregnancies affecting pregnancy, antepartum on their problem list.  Patient reports no complaints.  Contractions: Irritability (Left sided abdomen). Vag. Bleeding: None.  Movement: Present. Denies leaking of fluid.   The following portions of the patient's history were reviewed and updated as appropriate: allergies, current medications, past family history, past medical history, past social history, past surgical history and problem list.   Objective:   Vitals:   01/07/23 0836  BP: 109/73  Pulse: 80  Weight: 125 lb 12.8 oz (57.1 kg)    Fetal Status: Fetal Heart Rate (bpm): 152   Movement: Present     General:  Alert, oriented and cooperative. Patient is in no acute distress.  Skin: Skin is warm and dry. No rash noted.   Cardiovascular: Normal heart rate noted  Respiratory: Normal respiratory effort, no problems with respiration noted  Abdomen: Soft, gravid, appropriate for gestational age.  Pain/Pressure: Present     Pelvic: Cervical exam deferred        Extremities: Normal range of motion.  Edema: Trace  Mental Status: Normal mood and affect. Normal behavior. Normal judgment and thought content.   Assessment and Plan:  Pregnancy: G2P1001 at [redacted]w[redacted]d 1. Supervision of other normal pregnancy, antepartum Patient is doing well without complaints Third trimester labs and glucola today  2. Short interval between pregnancies affecting pregnancy, antepartum   Preterm labor symptoms and general obstetric precautions including but not limited to vaginal bleeding, contractions, leaking of fluid and fetal movement were reviewed in detail with the patient. Please refer to After Visit Summary for  other counseling recommendations.   No follow-ups on file.  Future Appointments  Date Time Provider Department Center  01/07/2023  8:55 AM Nikaya Nasby, Gigi Gin, MD [facility]  01/21/2023  8:55 AM Anyanwu, Jethro Bastos, MD [facility]  02/04/2023  8:55 AM Milas Hock, MD [facility]    Catalina Antigua, MD

## 2023-01-08 LAB — CBC
Hematocrit: 32.7 % — ABNORMAL LOW (ref 34.0–46.6)
Hemoglobin: 10.6 g/dL — ABNORMAL LOW (ref 11.1–15.9)
MCH: 28.4 pg (ref 26.6–33.0)
MCHC: 32.4 g/dL (ref 31.5–35.7)
MCV: 88 fL (ref 79–97)
Platelets: 320 10*3/uL (ref 150–450)
RBC: 3.73 x10E6/uL — ABNORMAL LOW (ref 3.77–5.28)
RDW: 11.9 % (ref 11.7–15.4)
WBC: 9.5 10*3/uL (ref 3.4–10.8)

## 2023-01-08 LAB — GLUCOSE TOLERANCE, 2 HOURS W/ 1HR
Glucose, 1 hour: 128 mg/dL (ref 70–179)
Glucose, 2 hour: 103 mg/dL (ref 70–152)
Glucose, Fasting: 85 mg/dL (ref 70–91)

## 2023-01-08 LAB — HIV ANTIBODY (ROUTINE TESTING W REFLEX): HIV Screen 4th Generation wRfx: NONREACTIVE

## 2023-01-08 LAB — RPR: RPR Ser Ql: NONREACTIVE

## 2023-01-18 DIAGNOSIS — Z419 Encounter for procedure for purposes other than remedying health state, unspecified: Secondary | ICD-10-CM | POA: Diagnosis not present

## 2023-01-21 ENCOUNTER — Ambulatory Visit (INDEPENDENT_AMBULATORY_CARE_PROVIDER_SITE_OTHER): Payer: Medicaid Other | Admitting: Obstetrics & Gynecology

## 2023-01-21 ENCOUNTER — Encounter: Payer: Self-pay | Admitting: Obstetrics & Gynecology

## 2023-01-21 VITALS — BP 100/64 | HR 74 | Wt 124.0 lb

## 2023-01-21 DIAGNOSIS — Z3A29 29 weeks gestation of pregnancy: Secondary | ICD-10-CM

## 2023-01-21 DIAGNOSIS — Z3480 Encounter for supervision of other normal pregnancy, unspecified trimester: Secondary | ICD-10-CM

## 2023-01-21 NOTE — Patient Instructions (Signed)
Return to office for any scheduled appointments. Call the office or go to the MAU at [facility] if: You begin to have strong, frequent contractions Your water breaks.  Sometimes it is a big gush of fluid, sometimes it is just a trickle that keeps getting your underwear wet or running down your legs You have vaginal bleeding.  It is normal to have a small amount of spotting if your cervix was checked.  You do not feel your baby moving like normal.  If you do not, get something to eat and drink and lay down and focus on feeling your baby move.   If your baby is still not moving like normal, you should call the office or go to MAU. Any other obstetric concerns.  

## 2023-01-21 NOTE — Progress Notes (Signed)
   PRENATAL VISIT NOTE  Subjective:  Joy Dyer is a 22 y.o. G2P1001 at [redacted]w[redacted]d being seen today for ongoing prenatal care.  She is currently monitored for the following issues for this low-risk pregnancy and has Encounter for supervision of normal pregnancy, antepartum and Short interval between pregnancies affecting pregnancy, antepartum on their problem list.  Patient reports no complaints.  Contractions: Not present. Vag. Bleeding: None.  Movement: Present. Denies leaking of fluid.   The following portions of the patient's history were reviewed and updated as appropriate: allergies, current medications, past family history, past medical history, past social history, past surgical history and problem list.   Objective:   Vitals:   01/21/23 0911  BP: 100/64  Pulse: 74  Weight: 124 lb (56.2 kg)    Fetal Status: Fetal Heart Rate (bpm): 142 Fundal Height: 29 cm Movement: Present     General:  Alert, oriented and cooperative. Patient is in no acute distress.  Skin: Skin is warm and dry. No rash noted.   Cardiovascular: Normal heart rate noted  Respiratory: Normal respiratory effort, no problems with respiration noted  Abdomen: Soft, gravid, appropriate for gestational age.  Pain/Pressure: Present     Pelvic: Cervical exam deferred        Extremities: Normal range of motion.  Edema: None  Mental Status: Normal mood and affect. Normal behavior. Normal judgment and thought content.   Assessment and Plan:  Pregnancy: G2P1001 at [redacted]w[redacted]d 1. [redacted] weeks gestation of pregnancy 2. Supervision of other normal pregnancy, antepartum Normal third trimester labs. Declined Tdap again today.  Preterm labor symptoms and general obstetric precautions including but not limited to vaginal bleeding, contractions, leaking of fluid and fetal movement were reviewed in detail with the patient. Please refer to After Visit Summary for other counseling recommendations.   Return in about 2 weeks (around 02/04/2023)  for OFFICE OB VISIT (MD or APP).  Future Appointments  Date Time Provider Department Center  02/04/2023  8:55 AM [name], MD [facility]  02/18/2023  8:55 AM [place] Bing, MD [facility]  03/04/2023  8:55 AM [place] Large, Jethro Bastos, MD [facility]  03/11/2023  8:55 AM [place] Large, Jethro Bastos, MD [facility]  03/18/2023  8:55 AM Sheri Gatchel, Jethro Bastos, MD [facility]    Jaynie Collins, MD

## 2023-02-04 ENCOUNTER — Ambulatory Visit (INDEPENDENT_AMBULATORY_CARE_PROVIDER_SITE_OTHER): Payer: Medicaid Other | Admitting: Family Medicine

## 2023-02-04 VITALS — BP 96/61 | HR 88 | Wt 126.0 lb

## 2023-02-04 DIAGNOSIS — Z3480 Encounter for supervision of other normal pregnancy, unspecified trimester: Secondary | ICD-10-CM

## 2023-02-04 DIAGNOSIS — O09899 Supervision of other high risk pregnancies, unspecified trimester: Secondary | ICD-10-CM

## 2023-02-04 NOTE — Progress Notes (Signed)
   PRENATAL VISIT NOTE  Subjective:  Joy Dyer is a 22 y.o. G2P1001 at [redacted]w[redacted]d being seen today for ongoing prenatal care.  She is currently monitored for the following issues for this low-risk pregnancy and has Encounter for supervision of normal pregnancy, antepartum and Short interval between pregnancies affecting pregnancy, antepartum on their problem list.  Patient reports contractions since last pm .  Contractions: Not present (did have some last night). Vag. Bleeding: None.  Movement: Present. Denies leaking of fluid.   The following portions of the patient's history were reviewed and updated as appropriate: allergies, current medications, past family history, past medical history, past social history, past surgical history and problem list.   Objective:   Vitals:   02/04/23 0913  BP: 96/61  Pulse: 88  Weight: 126 lb (57.2 kg)    Fetal Status: Fetal Heart Rate (bpm): 139 Fundal Height: 32 cm Movement: Present     General:  Alert, oriented and cooperative. Patient is in no acute distress.  Skin: Skin is warm and dry. No rash noted.   Cardiovascular: Normal heart rate noted  Respiratory: Normal respiratory effort, no problems with respiration noted  Abdomen: Soft, gravid, appropriate for gestational age.  Pain/Pressure: Absent     Pelvic: Cervical exam performed in the presence of a chaperone Dilation: Closed Effacement (%): Thick Station: Ballotable  Extremities: Normal range of motion.  Edema: None  Mental Status: Normal mood and affect. Normal behavior. Normal judgment and thought content.   Assessment and Plan:  Pregnancy: G2P1001 at [redacted]w[redacted]d 1. Supervision of other normal pregnancy, antepartum Continue routine prenatal care. No evidence of PTL on cervix  2. Short interval between pregnancies affecting pregnancy, antepartum   Preterm labor symptoms and general obstetric precautions including but not limited to vaginal bleeding, contractions, leaking of fluid and fetal  movement were reviewed in detail with the patient. Please refer to After Visit Summary for other counseling recommendations.   Return in 2 weeks (on 02/18/2023).  Future Appointments  Date Time Provider Department Center  02/18/2023  8:55 AM [place] Bing, MD [facility]  03/04/2023  8:55 AM Anyanwu, Jethro Bastos, MD [facility]  03/11/2023  8:55 AM [place] Large, Jethro Bastos, MD [facility]  03/18/2023  8:55 AM [place] Large, Jethro Bastos, MD [facility]  03/25/2023 10:35 AM [place] Large, Jethro Bastos, MD [facility]  04/01/2023  9:35 AM Milas Hock, MD [facility]    [name], MD

## 2023-02-04 NOTE — Progress Notes (Signed)
CC: Pt stating that last night she was having persistent contractions for several minutes and then she went to sleep

## 2023-02-17 DIAGNOSIS — Z419 Encounter for procedure for purposes other than remedying health state, unspecified: Secondary | ICD-10-CM | POA: Diagnosis not present

## 2023-02-18 ENCOUNTER — Ambulatory Visit (INDEPENDENT_AMBULATORY_CARE_PROVIDER_SITE_OTHER): Payer: Medicaid Other | Admitting: Obstetrics and Gynecology

## 2023-02-18 ENCOUNTER — Encounter: Payer: Self-pay | Admitting: Obstetrics and Gynecology

## 2023-02-18 VITALS — BP 116/74 | HR 90 | Wt 128.0 lb

## 2023-02-18 DIAGNOSIS — Z3A33 33 weeks gestation of pregnancy: Secondary | ICD-10-CM

## 2023-02-18 DIAGNOSIS — O99013 Anemia complicating pregnancy, third trimester: Secondary | ICD-10-CM

## 2023-02-18 MED ORDER — FERROUS SULFATE 324 MG PO TBEC: 324.0000 mg | DELAYED_RELEASE_TABLET | ORAL | 0 refills | Status: DC

## 2023-02-18 NOTE — Progress Notes (Signed)
   PRENATAL VISIT NOTE  Subjective:  Joy Dyer is a 22 y.o. G2P1001 at [redacted]w[redacted]d being seen today for ongoing prenatal care.  She is currently monitored for the following issues for this low-risk pregnancy and has Encounter for supervision of normal pregnancy, antepartum; Short interval between pregnancies affecting pregnancy, antepartum; and Anemia during pregnancy in third trimester on their problem list.  Patient reports  see RN note. Has occasional numbness in hands .  Contractions: Irregular. Vag. Bleeding: None.  Movement: Present. Denies leaking of fluid.   The following portions of the patient's history were reviewed and updated as appropriate: allergies, current medications, past family history, past medical history, past social history, past surgical history and problem list.   Objective:   Vitals:   02/18/23 0940  BP: 116/74  Pulse: 90  Weight: 128 lb (58.1 kg)    Fetal Status: Fetal Heart Rate (bpm): 140 Fundal Height: 32 cm Movement: Present     General:  Alert, oriented and cooperative. Patient is in no acute distress.  Skin: Skin is warm and dry. No rash noted.   Cardiovascular: Normal heart rate noted  Respiratory: Normal respiratory effort, no problems with respiration noted  Abdomen: Soft, gravid, appropriate for gestational age.  Pain/Pressure: Present     Pelvic: Cervical exam deferred        Extremities: Normal range of motion.  Edema: Trace  Mental Status: Normal mood and affect. Normal behavior. Normal judgment and thought content.   Assessment and Plan:  Pregnancy: G2P1001 at [redacted]w[redacted]d 1. Anemia during pregnancy in third trimester Pt amenable to every other day iron  2. [redacted] weeks gestation of pregnancy Options d/w her and ?depo provera.   Preterm labor symptoms and general obstetric precautions including but not limited to vaginal bleeding, contractions, leaking of fluid and fetal movement were reviewed in detail with the patient. Please refer to After Visit  Summary for other counseling recommendations.   No follow-ups on file.  Future Appointments  Date Time Provider Department Center  03/04/2023  8:55 AM Anyanwu, Jethro Bastos, MD [facility]  03/11/2023  8:55 AM Anyanwu, Jethro Bastos, MD [facility]  03/18/2023  8:55 AM Macon Large, Jethro Bastos, MD [facility]  03/25/2023 10:35 AM Macon Large, Jethro Bastos, MD [facility]  04/01/2023  9:35 AM Milas Hock, MD [facility]    Beclabito Bing, MD

## 2023-02-18 NOTE — Progress Notes (Signed)
ROB: Pt stating that this AM her finger nails were blue this am, Wants to discuss heartburn

## 2023-03-04 ENCOUNTER — Ambulatory Visit: Payer: Medicaid Other | Admitting: Obstetrics & Gynecology

## 2023-03-04 ENCOUNTER — Other Ambulatory Visit (HOSPITAL_COMMUNITY)
Admission: RE | Admit: 2023-03-04 | Discharge: 2023-03-04 | Disposition: A | Discharge status: Home / Self Care | Payer: Medicaid Other | Source: Ambulatory Visit | Attending: Obstetrics & Gynecology | Admitting: Obstetrics & Gynecology

## 2023-03-04 VITALS — BP 105/63 | HR 85 | Wt 130.0 lb

## 2023-03-04 DIAGNOSIS — Z3480 Encounter for supervision of other normal pregnancy, unspecified trimester: Secondary | ICD-10-CM

## 2023-03-04 DIAGNOSIS — Z3A35 35 weeks gestation of pregnancy: Secondary | ICD-10-CM

## 2023-03-04 DIAGNOSIS — O23593 Infection of other part of genital tract in pregnancy, third trimester: Secondary | ICD-10-CM | POA: Diagnosis present

## 2023-03-04 NOTE — Progress Notes (Signed)
   PRENATAL VISIT NOTE  Subjective:  Joy Dyer is a 22 y.o. G2P1001 at [redacted]w[redacted]d being seen today for ongoing prenatal care.  She is currently monitored for the following issues for this low-risk pregnancy and has Encounter for supervision of normal pregnancy, antepartum; Short interval between pregnancies affecting pregnancy, antepartum; and Anemia during pregnancy in third trimester on their problem list.  Patient reports vaginal discharge and irritation for a few days.  Contractions: Irritability. Vag. Bleeding: None.  Movement: Present. Denies leaking of fluid.   The following portions of the patient's history were reviewed and updated as appropriate: allergies, current medications, past family history, past medical history, past social history, past surgical history and problem list.   Objective:   Vitals:   03/04/23 0922  BP: 105/63  Pulse: 85  Weight: 130 lb (59 kg)    Fetal Status: Fetal Heart Rate (bpm): 142 Fundal Height: 36 cm Movement: Present  Presentation: Vertex  General:  Alert, oriented and cooperative. Patient is in no acute distress.  Skin: Skin is warm and dry. No rash noted.   Cardiovascular: Normal heart rate noted  Respiratory: Normal respiratory effort, no problems with respiration noted  Abdomen: Soft, gravid, appropriate for gestational age.  Pain/Pressure: Present     Pelvic: Cervical exam performed in the presence of a chaperone Dilation: 1 Effacement (%): Thick Station: Ballotable, cultures done  Extremities: Normal range of motion.  Edema: Trace  Mental Status: Normal mood and affect. Normal behavior. Normal judgment and thought content.   PHQ 9 = 13 GAD 7= 19  Assessment and Plan:  Pregnancy: G2P1001 at [redacted]w[redacted]d 1. Vaginitis affecting pregnancy in third trimester, antepartum - Cervicovaginal ancillary only done.  Proper vulvar hygiene emphasized: discussed avoidance of perfumed soaps, detergents, lotions and any type of douches; in addition to wearing  cotton underwear and no underwear at night.  Also recommended cleaning front to back, voiding and cleaning up after intercourse.   2. [redacted] weeks gestation of pregnancy 3. Supervision of other normal pregnancy, antepartum Pelvic cultures done, will follow up results and manage accordingly. - Cervicovaginal ancillary only - Strep Gp B NAA Preterm labor symptoms and general obstetric precautions including but not limited to vaginal bleeding, contractions, leaking of fluid and fetal movement were reviewed in detail with the patient. Please refer to After Visit Summary for other counseling recommendations.   Return in about 1 week (around 03/11/2023) for OFFICE OB VISIT as scheduled.  Future Appointments  Date Time Provider Department Center  03/11/2023  8:55 AM Kenlie Seki, Jethro Bastos, MD [facility]  03/18/2023  8:55 AM Robi Dewolfe, Jethro Bastos, MD [facility]  03/25/2023 10:35 AM Macon Large, Jethro Bastos, MD [facility]  04/01/2023  9:35 AM Milas Hock, MD [facility]    Jaynie Collins, MD

## 2023-03-04 NOTE — Patient Instructions (Signed)

## 2023-03-05 LAB — CERVICOVAGINAL ANCILLARY ONLY
Bacterial Vaginitis (gardnerella): NEGATIVE
Candida Glabrata: NEGATIVE
Candida Vaginitis: NEGATIVE
Chlamydia: NEGATIVE
Comment: NEGATIVE
Comment: NEGATIVE
Comment: NEGATIVE
Comment: NEGATIVE
Comment: NEGATIVE
Comment: NORMAL
Neisseria Gonorrhea: NEGATIVE
Trichomonas: NEGATIVE

## 2023-03-06 LAB — STREP GP B NAA: Strep Gp B NAA: NEGATIVE

## 2023-03-11 ENCOUNTER — Ambulatory Visit (INDEPENDENT_AMBULATORY_CARE_PROVIDER_SITE_OTHER): Payer: Medicaid Other | Admitting: Obstetrics & Gynecology

## 2023-03-11 VITALS — BP 106/70 | HR 91 | Wt 131.0 lb

## 2023-03-11 DIAGNOSIS — Z3A36 36 weeks gestation of pregnancy: Secondary | ICD-10-CM

## 2023-03-11 DIAGNOSIS — Z3480 Encounter for supervision of other normal pregnancy, unspecified trimester: Secondary | ICD-10-CM

## 2023-03-11 NOTE — Progress Notes (Signed)
   PRENATAL VISIT NOTE  Subjective:  Joy Dyer is a 22 y.o. G2P1001 at [redacted]w[redacted]d being seen today for ongoing prenatal care.  She is currently monitored for the following issues for this low-risk pregnancy and has Encounter for supervision of normal pregnancy, antepartum; Short interval between pregnancies affecting pregnancy, antepartum; and Anemia during pregnancy in third trimester on their problem list.  Patient reports no complaints.  Contractions: Irritability. Vag. Bleeding: None.  Movement: Present. Denies leaking of fluid.   The following portions of the patient's history were reviewed and updated as appropriate: allergies, current medications, past family history, past medical history, past social history, past surgical history and problem list.   Objective:   Vitals:   03/11/23 0904  BP: 106/70  Pulse: 91  Weight: 131 lb (59.4 kg)    Fetal Status: Fetal Heart Rate (bpm): 136 Fundal Height: 37 cm Movement: Present     General:  Alert, oriented and cooperative. Patient is in no acute distress.  Skin: Skin is warm and dry. No rash noted.   Cardiovascular: Normal heart rate noted  Respiratory: Normal respiratory effort, no problems with respiration noted  Abdomen: Soft, gravid, appropriate for gestational age.  Pain/Pressure: Present     Pelvic: Cervical exam deferred        Extremities: Normal range of motion.  Edema: None  Mental Status: Normal mood and affect. Normal behavior. Normal judgment and thought content.   Results for orders placed or performed in visit on 03/04/23 (from the past 336 hour(s))  Cervicovaginal ancillary only   Collection Time: 03/04/23  9:31 AM  Result Value Ref Range   Bacterial Vaginitis (gardnerella) Negative    Candida Vaginitis Negative    Candida Glabrata Negative    Trichomonas Negative    Chlamydia Negative    Neisseria Gonorrhea Negative    Comment Normal Reference Range Candida Species - Negative    Comment      Normal Reference  Range Bacterial Vaginosis - Negative   Comment Normal Reference Range Candida Galbrata - Negative    Comment Normal Reference Range Trichomonas - Negative    Comment Normal Reference Ranger Chlamydia - Negative    Comment      Normal Reference Range Neisseria Gonorrhea - Negative  Strep Gp B NAA   Collection Time: 03/04/23 10:11 AM   Specimen: Vaginal/Rectal; Genital   VR  Result Value Ref Range   Strep Gp B NAA Negative Negative    Assessment and Plan:  Pregnancy: G2P1001 at [redacted]w[redacted]d 1. [redacted] weeks gestation of pregnancy 2. Supervision of other normal pregnancy, antepartum Negative pelvic cultures, discussed with patient. Preterm labor symptoms and general obstetric precautions including but not limited to vaginal bleeding, contractions, leaking of fluid and fetal movement were reviewed in detail with the patient. Please refer to After Visit Summary for other counseling recommendations.   Return in about 1 week (around 03/18/2023) for OFFICE OB VISIT (MD only).  Future Appointments  Date Time Provider Department Center  03/18/2023  8:55 AM Anamae Rochelle, Jethro Bastos, MD [facility]  03/25/2023 10:35 AM Ahmaya Ostermiller, Jethro Bastos, MD [facility]  04/01/2023  9:35 AM Milas Hock, MD [facility]    Jaynie Collins, MD

## 2023-03-11 NOTE — Patient Instructions (Signed)

## 2023-03-18 ENCOUNTER — Encounter: Payer: Self-pay | Admitting: Obstetrics & Gynecology

## 2023-03-18 ENCOUNTER — Ambulatory Visit (INDEPENDENT_AMBULATORY_CARE_PROVIDER_SITE_OTHER): Payer: Medicaid Other | Admitting: Obstetrics & Gynecology

## 2023-03-18 VITALS — BP 110/65 | HR 73 | Wt 134.0 lb

## 2023-03-18 DIAGNOSIS — Z3480 Encounter for supervision of other normal pregnancy, unspecified trimester: Secondary | ICD-10-CM

## 2023-03-18 DIAGNOSIS — Z3A37 37 weeks gestation of pregnancy: Secondary | ICD-10-CM

## 2023-03-18 NOTE — Progress Notes (Signed)
   PRENATAL VISIT NOTE  Subjective:  Joy Dyer is a 22 y.o. G2P1001 at [redacted]w[redacted]d being seen today for ongoing prenatal care.  She is currently monitored for the following issues for this low-risk pregnancy and has Encounter for supervision of normal pregnancy, antepartum; Short interval between pregnancies affecting pregnancy, antepartum; and Anemia during pregnancy in third trimester on their problem list.  Patient reports occasional contractions.  Contractions: Not present. Vag. Bleeding: None.  Movement: Present. Denies leaking of fluid.   The following portions of the patient's history were reviewed and updated as appropriate: allergies, current medications, past family history, past medical history, past social history, past surgical history and problem list.   Objective:   Vitals:   03/18/23 0913  BP: 110/65  Pulse: 73  Weight: 134 lb (60.8 kg)    Fetal Status: Fetal Heart Rate (bpm): 132 Fundal Height: 38 cm Movement: Present  Presentation: Vertex  General:  Alert, oriented and cooperative. Patient is in no acute distress.  Skin: Skin is warm and dry. No rash noted.   Cardiovascular: Normal heart rate noted  Respiratory: Normal respiratory effort, no problems with respiration noted  Abdomen: Soft, gravid, appropriate for gestational age.  Pain/Pressure: Present     Pelvic: Cervical exam performed in the presence of a chaperone Dilation: 1 Effacement (%): 70 Station: -3  Extremities: Normal range of motion.  Edema: Trace  Mental Status: Normal mood and affect. Normal behavior. Normal judgment and thought content.   Assessment and Plan:  Pregnancy: G2P1001 at [redacted]w[redacted]d 1. [redacted] weeks gestation of pregnancy 2. Supervision of other normal pregnancy, antepartum Negative pelvic cultures, reviewed with patient.  Labor symptoms and general obstetric precautions including but not limited to vaginal bleeding, contractions, leaking of fluid and fetal movement were reviewed in detail with the  patient. Please refer to After Visit Summary for other counseling recommendations.   Return in about 1 week (around 03/25/2023) for OFFICE OB VISIT (MD or APP).  Future Appointments  Date Time Provider Department Center  03/25/2023 10:35 AM Dresean Beckel, Jethro Bastos, MD [facility]  04/01/2023  9:35 AM Milas Hock, MD [facility]    Jaynie Collins, MD

## 2023-03-18 NOTE — Patient Instructions (Signed)

## 2023-03-20 DIAGNOSIS — Z419 Encounter for procedure for purposes other than remedying health state, unspecified: Secondary | ICD-10-CM | POA: Diagnosis not present

## 2023-03-21 ENCOUNTER — Inpatient Hospital Stay (HOSPITAL_COMMUNITY)
Admission: AD | Admit: 2023-03-21 | Discharge: 2023-03-21 | Disposition: A | Discharge status: Home / Self Care | Payer: Medicaid Other | Attending: Obstetrics and Gynecology | Admitting: Obstetrics and Gynecology

## 2023-03-21 ENCOUNTER — Encounter (HOSPITAL_COMMUNITY): Payer: Self-pay | Admitting: Obstetrics and Gynecology

## 2023-03-21 DIAGNOSIS — Z3A37 37 weeks gestation of pregnancy: Secondary | ICD-10-CM

## 2023-03-21 DIAGNOSIS — O479 False labor, unspecified: Secondary | ICD-10-CM

## 2023-03-21 DIAGNOSIS — O471 False labor at or after 37 completed weeks of gestation: Secondary | ICD-10-CM | POA: Insufficient documentation

## 2023-03-21 LAB — POCT FERN TEST: POCT Fern Test: NEGATIVE

## 2023-03-21 MED ORDER — OXYCODONE-ACETAMINOPHEN 5-325 MG PO TABS
1.0000 | ORAL_TABLET | Freq: Once | ORAL | Status: AC
  Administered 2023-03-21: 1 via ORAL
  Filled 2023-03-21: qty 1

## 2023-03-21 NOTE — MAU Provider Note (Signed)
Seen by provider at 0640     S: Ms. Joy Dyer is a 22 y.o. G2P1001 at [redacted]w[redacted]d  who presents to MAU today complaining contractions  She denies vaginal bleeding. She denies LOF. She reports normal fetal movement.    O: BP 109/63   Pulse 90   Temp 98.1 F (36.7 C) (Oral)   Resp 18   Ht 5' (1.524 m)   Wt 63.5 kg   LMP 05/31/2022   SpO2 96%   BMI 27.34 kg/m  GENERAL: Well-developed, well-nourished female in no acute distress.  HEAD: Normocephalic, atraumatic.  CHEST: Normal effort of breathing, regular heart rate ABDOMEN: Soft, nontender, gravid  Cervical exam:  Dilation: 3 Effacement (%): 70 Station: -2 Exam by:: Ana R. RN   Fetal Monitoring: Baseline: 140 Variability: average Accelerations: present Decelerations: absent Contractions: irregular  Given a single Percocet for pain Offered a third hour recheck or discharge home Wants to go home  A: SIUP at [redacted]w[redacted]d  False labor  P: Discharge home Labor precautions Encouraged to return if she develops worsening of symptoms, increase in pain, fever, or other concerning symptoms.     Aviva Signs, CNM 03/21/2023 6:46 AM

## 2023-03-21 NOTE — MAU Note (Signed)
.  Joy Dyer is a 22 y.o. at [redacted]w[redacted]d here in MAU reporting: contractions starting at 2300 last PM; reports they are every 3-5 minutes. Reports clear watery discharge since about the same time patient reports contractions started; reports wearing a panty liner at this time. Reports sexual intercourse prior to discharge occurring  Denies VB and reports +FM.   Onset of complaint: 2300 Pain score: 7/10 Vitals:   03/21/23 0450  BP: 109/63  Pulse: 90  Resp: 18  Temp: 98.1 F (36.7 C)  SpO2: 96%     FHT:140 Lab orders placed from triage:  mau labor

## 2023-03-24 ENCOUNTER — Other Ambulatory Visit: Payer: Self-pay

## 2023-03-24 ENCOUNTER — Inpatient Hospital Stay (HOSPITAL_COMMUNITY)
Admission: AD | Admit: 2023-03-24 | Payer: Medicaid Other | Source: Home / Self Care | Attending: Obstetrics & Gynecology | Admitting: Obstetrics & Gynecology

## 2023-03-24 ENCOUNTER — Encounter (HOSPITAL_COMMUNITY): Payer: Self-pay | Admitting: Obstetrics and Gynecology

## 2023-03-24 ENCOUNTER — Inpatient Hospital Stay (HOSPITAL_COMMUNITY): Payer: Medicaid Other | Admitting: Anesthesiology

## 2023-03-24 DIAGNOSIS — O9902 Anemia complicating childbirth: Secondary | ICD-10-CM | POA: Diagnosis not present

## 2023-03-24 DIAGNOSIS — Z3A38 38 weeks gestation of pregnancy: Secondary | ICD-10-CM | POA: Diagnosis not present

## 2023-03-24 DIAGNOSIS — O99013 Anemia complicating pregnancy, third trimester: Secondary | ICD-10-CM | POA: Diagnosis present

## 2023-03-24 DIAGNOSIS — O4290 Premature rupture of membranes, unspecified as to length of time between rupture and onset of labor, unspecified weeks of gestation: Secondary | ICD-10-CM | POA: Diagnosis present

## 2023-03-24 DIAGNOSIS — O4292 Full-term premature rupture of membranes, unspecified as to length of time between rupture and onset of labor: Secondary | ICD-10-CM | POA: Diagnosis not present

## 2023-03-24 DIAGNOSIS — O26893 Other specified pregnancy related conditions, third trimester: Secondary | ICD-10-CM | POA: Diagnosis not present

## 2023-03-24 DIAGNOSIS — O09899 Supervision of other high risk pregnancies, unspecified trimester: Secondary | ICD-10-CM

## 2023-03-24 LAB — CBC
HCT: 32.8 % — ABNORMAL LOW (ref 36.0–46.0)
Hemoglobin: 10.2 g/dL — ABNORMAL LOW (ref 12.0–15.0)
MCH: 24 pg — ABNORMAL LOW (ref 26.0–34.0)
MCHC: 31.1 g/dL (ref 30.0–36.0)
MCV: 77.2 fL — ABNORMAL LOW (ref 80.0–100.0)
Platelets: 398 10*3/uL (ref 150–400)
RBC: 4.25 MIL/uL (ref 3.87–5.11)
RDW: 13.8 % (ref 11.5–15.5)
WBC: 9.8 10*3/uL (ref 4.0–10.5)
nRBC: 0 % (ref 0.0–0.2)

## 2023-03-24 LAB — RPR: RPR Ser Ql: NONREACTIVE

## 2023-03-24 LAB — TYPE AND SCREEN
ABO/RH(D): B POS
Antibody Screen: NEGATIVE

## 2023-03-24 LAB — HIV ANTIBODY (ROUTINE TESTING W REFLEX): HIV Screen 4th Generation wRfx: NONREACTIVE

## 2023-03-24 MED ORDER — OXYTOCIN-SODIUM CHLORIDE 30-0.9 UT/500ML-% IV SOLN: 2.5000 [IU]/h | INTRAVENOUS | Status: DC

## 2023-03-24 MED ORDER — LIDOCAINE HCL (PF) 1 % IJ SOLN: 30.0000 mL | INTRAMUSCULAR | Status: DC | PRN

## 2023-03-24 MED ORDER — DIPHENHYDRAMINE HCL 50 MG/ML IJ SOLN: 12.5000 mg | INTRAMUSCULAR | Status: DC | PRN

## 2023-03-24 MED ORDER — LACTATED RINGERS IV SOLN: 500.0000 mL | Freq: Once | INTRAVENOUS | Status: DC

## 2023-03-24 MED ORDER — PHENYLEPHRINE 80 MCG/ML (10ML) SYRINGE FOR IV PUSH (FOR BLOOD PRESSURE SUPPORT): 80.0000 ug | PREFILLED_SYRINGE | INTRAVENOUS | Status: DC | PRN

## 2023-03-24 MED ORDER — EPHEDRINE 5 MG/ML INJ: 10.0000 mg | INTRAVENOUS | Status: DC | PRN

## 2023-03-24 MED ORDER — OXYTOCIN-SODIUM CHLORIDE 30-0.9 UT/500ML-% IV SOLN
1.0000 m[IU]/min | INTRAVENOUS | Status: DC
  Administered 2023-03-24: 2 m[IU]/min via INTRAVENOUS
  Filled 2023-03-24: qty 500

## 2023-03-24 MED ORDER — TERBUTALINE SULFATE 1 MG/ML IJ SOLN: 0.2500 mg | Freq: Once | INTRAMUSCULAR | Status: DC | PRN

## 2023-03-24 MED ORDER — PHENYLEPHRINE 80 MCG/ML (10ML) SYRINGE FOR IV PUSH (FOR BLOOD PRESSURE SUPPORT)
80.0000 ug | PREFILLED_SYRINGE | INTRAVENOUS | Status: DC | PRN
  Filled 2023-03-24: qty 10

## 2023-03-24 MED ORDER — OXYTOCIN BOLUS FROM INFUSION
333.0000 mL | Freq: Once | INTRAVENOUS | Status: AC
  Administered 2023-03-24: 333 mL via INTRAVENOUS

## 2023-03-24 MED ORDER — OXYCODONE-ACETAMINOPHEN 5-325 MG PO TABS: 2.0000 | ORAL_TABLET | ORAL | Status: DC | PRN

## 2023-03-24 MED ORDER — ONDANSETRON HCL 4 MG/2ML IJ SOLN
4.0000 mg | Freq: Four times a day (QID) | INTRAMUSCULAR | Status: DC | PRN
  Administered 2023-03-24: 4 mg via INTRAVENOUS
  Filled 2023-03-24: qty 2

## 2023-03-24 MED ORDER — FENTANYL-BUPIVACAINE-NACL 0.5-0.125-0.9 MG/250ML-% EP SOLN
12.0000 mL/h | EPIDURAL | Status: DC | PRN
  Administered 2023-03-24: 12 mL/h via EPIDURAL
  Filled 2023-03-24: qty 250

## 2023-03-24 MED ORDER — ACETAMINOPHEN 325 MG PO TABS
650.0000 mg | ORAL_TABLET | ORAL | Status: DC | PRN
  Administered 2023-03-24: 650 mg via ORAL
  Filled 2023-03-24: qty 2

## 2023-03-24 MED ORDER — FENTANYL CITRATE (PF) 100 MCG/2ML IJ SOLN
50.0000 ug | INTRAMUSCULAR | Status: DC | PRN
  Administered 2023-03-24 (×2): 100 ug via INTRAVENOUS
  Filled 2023-03-24 (×2): qty 2

## 2023-03-24 MED ORDER — LIDOCAINE-EPINEPHRINE (PF) 2 %-1:200000 IJ SOLN
INTRAMUSCULAR | Status: DC | PRN
  Administered 2023-03-24: 5 mL via EPIDURAL

## 2023-03-24 MED ORDER — OXYCODONE-ACETAMINOPHEN 5-325 MG PO TABS: 1.0000 | ORAL_TABLET | ORAL | Status: DC | PRN

## 2023-03-24 MED ORDER — LACTATED RINGERS IV SOLN: 500.0000 mL | INTRAVENOUS | Status: DC | PRN

## 2023-03-24 MED ORDER — SOD CITRATE-CITRIC ACID 500-334 MG/5ML PO SOLN: 30.0000 mL | ORAL | Status: DC | PRN

## 2023-03-24 MED ORDER — LACTATED RINGERS IV SOLN: INTRAVENOUS | Status: DC

## 2023-03-24 NOTE — Progress Notes (Signed)
Labor Progress Note Jadalise Delaine is a 22 y.o. G2P1001 at [redacted]w[redacted]d presented for SROM. S: Patient is resting comfortably since epidural placement. Feeling more contractions pressure now that pitocin is at 6.  O:  BP 111/65   Pulse 90   Temp 98.3 F (36.8 C) (Oral)   Resp 14   Ht 5' (1.524 m)   Wt 59.4 kg   LMP 05/31/2022   SpO2 99%   BMI 25.58 kg/m  EFM: baseline 120/moderate variability/+ accels, intermittent variable decels, resolved with position changes Toco: q2-3 minutes  CVE: Dilation: 4 Effacement (%): 80 Cervical Position: Posterior Station: -1 Presentation: Vertex Exam by:: Dr. Miquel Dunn   A&P: 22 y.o. G2P1001 [redacted]w[redacted]d presented with SROM. #Labor: Progressing well. Got epidural, then pitocin started at 1730. Plan to re-check at 2030 about 3 hours after pitocin restarted, if no change consider IUPC placement. #Pain: epidural, comfortable, normotensive #FWB: overall reassuring with moderate variability and accels, intermittent variable decels that resolve with position changes, continue to monitor closely. #GBS negative  #Anemia- Hgb 10.2 on admission.  Billey Co, MD [facility], [facility] 7:34 PM

## 2023-03-24 NOTE — Progress Notes (Signed)
Labor Progress Note Joy Dyer is a 22 y.o. G2P1001 at [redacted]w[redacted]d presented for SROM and contractions S: Pt ambulating, comfortable, contracting  O:  BP 111/63   Pulse 68   Temp 98.3 F (36.8 C) (Oral)   Resp 15   Ht 5' (1.524 m)   Wt 59.4 kg   LMP 05/31/2022   SpO2 99%   BMI 25.58 kg/m  EFM: 130bpm/moderate/15x15 accel/no decel  CVE: Dilation: 4 Effacement (%): 80 Cervical Position: Posterior Station: -1 Presentation: Vertex Exam by:: Dr. Miquel Dunn   A&P: 22 y.o. G2P1001 [redacted]w[redacted]d presented for SROM and contractions #Labor: Progressing. New CVE as above. Pt very uncomfortable with CVE due to posterior cervix and low fetal station, pre-medicated with fentanyl. Will plan to start pit and pt would like an epidural #Pain: plan for epidural #FWB: cat 1 #GBS negative  #Anemia third trimester: Hb 10.2 on arrival today, pt is asymptomatic  [name], DO 3:03 PM

## 2023-03-24 NOTE — Discharge Summary (Incomplete)
Postpartum Discharge Summary  Date of Service updated***     Patient Name: Joy Dyer DOB: 2000-09-06 MRN: [id number]  Date of admission: 03/24/2023 Delivery date:03/24/2023 Delivering provider: Lavonda Jumbo Date of discharge: 03/24/2023  Admitting diagnosis: Premature rupture of membranes [O42.90] Intrauterine pregnancy: [redacted]w[redacted]d     Secondary diagnosis:  Principal Problem:   Premature rupture of membranes Active Problems:   Short interval between pregnancies affecting pregnancy, antepartum   Anemia during pregnancy in third trimester  Additional problems: ***    Discharge diagnosis: {DX.:23714}                                              Post partum procedures:{Postpartum procedures:23558} Augmentation: Pitocin Complications: None  Hospital course: Onset of Labor With Vaginal Delivery      22 y.o. yo G2P1001 at [redacted]w[redacted]d was admitted in Latent Labor on 03/24/2023. Labor course was uncomplicated  Membrane Rupture Time/Date: 8:00 AM,03/24/2023  Delivery Method:Vaginal, Spontaneous Operative Delivery:N/A Episiotomy: None Lacerations:  None Patient had a postpartum course complicated by ***.  She is ambulating, tolerating a regular diet, passing flatus, and urinating well. Patient is discharged home in stable condition on 03/24/23.  Newborn Data: Birth date:03/24/2023 Birth time:8:59 PM Gender:Female Living status:Living Apgars:9 ,9  Weight:   Magnesium Sulfate received: {Mag received:30440022} BMZ received: {BMZ received:30440023} Rhophylac:{Rhophylac received:30440032} LKG:{MWN:02725366} T-DaP:{Tdap:23962} Flu: {YQI:34742} Transfusion:{Transfusion received:30440034}  Physical exam  Vitals:   03/24/23 1830 03/24/23 1900 03/24/23 1930 03/24/23 2000  BP: (!) 110/53 111/65 121/70 (!) 117/56  Pulse: 79 90 (!) 104 (!) 105  Resp:      Temp:      TempSrc:      SpO2:      Weight:      Height:       General: {Exam; general:21111117} Lochia: {Desc;  appropriate/inappropriate:30686::"appropriate"} Uterine Fundus: {Desc; firm/soft:30687} Incision: {Exam; incision:21111123} DVT Evaluation: {Exam; dvt:2111122} Labs: Lab Results  Component Value Date   WBC 9.8 03/24/2023   HGB 10.2 (L) 03/24/2023   HCT 32.8 (L) 03/24/2023   MCV 77.2 (L) 03/24/2023   PLT 398 03/24/2023      Latest Ref Rng & Units 08/14/2022   11:39 AM  CMP  Glucose 70 - 99 mg/dL 595   BUN 6 - 20 mg/dL 6   Creatinine 6.38 - 7.56 mg/dL 4.33   Sodium 295 - 188 mmol/L 138   Potassium 3.5 - 5.1 mmol/L 3.7   Chloride 98 - 111 mmol/L 108   CO2 22 - 32 mmol/L 24   Calcium 8.9 - 10.3 mg/dL 8.9   Total Protein 6.5 - 8.1 g/dL 6.4   Total Bilirubin 0.3 - 1.2 mg/dL 0.4   Alkaline Phos 38 - 126 U/L 51   AST 15 - 41 U/L 16   ALT 0 - 44 U/L 17    Edinburgh Score:    12/19/2021    2:29 PM  Edinburgh Postnatal Depression Scale Screening Tool  I have been able to laugh and see the funny side of things. 0  I have looked forward with enjoyment to things. 1  I have blamed myself unnecessarily when things went wrong. 0  I have been anxious or worried for no good reason. 1  I have felt scared or panicky for no good reason. 0  Things have been getting on top of me. 0  I have been so unhappy  that I have had difficulty sleeping. 1  I have felt sad or miserable. 2  I have been so unhappy that I have been crying. 2  The thought of harming myself has occurred to me. 0  Edinburgh Postnatal Depression Scale Total 7     After visit meds:  Allergies as of 03/24/2023   No Known Allergies   Med Rec must be completed prior to using this [facility]***        Discharge home in stable condition Infant Feeding: {Baby feeding:23562} Infant Disposition:{CHL IP OB HOME WITH WGNFAO:13086} Discharge instruction: per After Visit Summary and Postpartum booklet. Activity: Advance as tolerated. Pelvic rest for 6 weeks.  Diet: {OB VHQI:69629528} Future Appointments: Future Appointments   Date Time Provider Department Center  03/25/2023 10:35 AM Anyanwu, Jethro Bastos, MD [facility]  04/01/2023  9:35 AM Milas Hock, MD [facility]   Follow up Visit:  Message sent to [facility] by Autry-Lott on 03/24/2023  Please schedule this patient for a In person postpartum visit in 6 weeks with the following provider: Any provider. Additional Postpartum F/U: n/a   Low risk pregnancy complicated by:  short interval pregnancy, anemia Delivery mode:  Vaginal, Spontaneous Anticipated Birth Control:  Depo   03/24/2023 Randa Evens Autry-Lott, DO

## 2023-03-24 NOTE — Anesthesia Procedure Notes (Signed)
Epidural Patient location during procedure: OB Start time: 03/24/2023 4:36 PM End time: 03/24/2023 4:41 PM  Staffing Anesthesiologist: Shelton Silvas, MD Performed: anesthesiologist   Preanesthetic Checklist Completed: patient identified, IV checked, site marked, risks and benefits discussed, surgical consent, monitors and equipment checked, pre-op evaluation and timeout performed  Epidural Patient position: sitting Prep: DuraPrep Patient monitoring: heart rate, continuous pulse ox and blood pressure Approach: midline Location: L3-L4 Injection technique: LOR saline  Needle:  Needle type: Tuohy  Needle gauge: 17 G Needle length: 9 cm Catheter type: closed end flexible Catheter size: 20 Guage Test dose: negative and 1.5% lidocaine  Assessment Events: blood not aspirated, no cerebrospinal fluid, injection not painful, no injection resistance and no paresthesia  Additional Notes LOR @ 4  Patient identified. Risks/Benefits/Options discussed with patient including but not limited to bleeding, infection, nerve damage, paralysis, failed block, incomplete pain control, headache, blood pressure changes, nausea, vomiting, reactions to medications, itching and postpartum back pain. Confirmed with bedside nurse the patient's most recent platelet count. Confirmed with patient that they are not currently taking any anticoagulation, have any bleeding history or any family history of bleeding disorders. Patient expressed understanding and wished to proceed. All questions were answered. Sterile technique was used throughout the entire procedure. Please see nursing notes for vital signs. Test dose was given through epidural catheter and negative prior to continuing to dose epidural or start infusion. Warning signs of high block given to the patient including shortness of breath, tingling/numbness in hands, complete motor block, or any concerning symptoms with instructions to call for help. Patient was  given instructions on fall risk and not to get out of bed. All questions and concerns addressed with instructions to call with any issues or inadequate analgesia.    Reason for block:procedure for pain

## 2023-03-24 NOTE — H&P (Cosign Needed Addendum)
OBSTETRIC ADMISSION HISTORY AND PHYSICAL  Joy Dyer is a 22 y.o. female G2P1001 with IUP at [redacted]w[redacted]d by [place] [redacted]w[redacted]d presenting for SROM and contractions. LOF clear this Am around 0800. Small amount of blood tinge but not large amounts of bleeding or blood clots. She reports +FMs, no blurry vision, headaches or peripheral edema, and RUQ pain.  She plans on breast and bottle feeding. She request depo for birth control. She received her prenatal care at [facility]   Dating: By [redacted]w[redacted]d [place] --->  Estimated Date of Delivery: 04/06/23  Sono:    @[redacted]w[redacted]d , CWD, normal anatomy, cephalic presentation, anterior placenta, 1004g, 47% EFW   Prenatal History/Complications: anemia during third trimester pregnancy  Past Medical History: Past Medical History:  Diagnosis Date   Anxiety    Exacerbated by pregnancy; also excessive by grandfather in ICU.   Headache    Irregular heartbeat 10/18/2021   During 2023 pregnancy. Seen by Cardiology. Some PACs and PVCs seen.   Near syncope 08/31/2021   During 2023 pregnancy. Seen by Cardiology. Some PACs and PVCs seen.   Systolic murmur 08/31/2021   Normal pregnancy echo 2023. Likely flow murmur    Past Surgical History: Past Surgical History:  Procedure Laterality Date   None      Obstetrical History: OB History     Gravida  2   Para  1   Term  1   Preterm      AB      Living  1      SAB      IAB      Ectopic      Multiple  0   Live Births  1           Social History Social History   Socioeconomic History   Marital status: Single    Spouse name: Not on file   Number of children: Not on file   Years of education: Not on file   Highest education level: High school graduate  Occupational History   Occupation: International aid/development worker  Tobacco Use   Smoking status: Never   Smokeless tobacco: Never  Vaping Use   Vaping status: Former  Substance and Sexual Activity   Alcohol use: Not Currently   Drug use: Never   Sexual  activity: Yes    Birth control/protection: None  Other Topics Concern   Not on file  Social History Narrative   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  Off-and-on living with her boyfriend (who is the father of her unborn child).  She has significant anxiety about their relationship and about his ability to have maturity to be an active father.  She acknowledges that he has 29 year old boy and may not have the maturity to stick around.      She also has significant stress going on right now.  Her grandfather is in the ICU at [facility].  It sounds like he is probably being placed on comfort care measures.  She is quite sad and under lots of stress.   Social Determinants of Health   Financial Resource Strain: Not on file  Food Insecurity: No Food Insecurity (03/24/2023)   Hunger Vital Sign    Worried About Running Out of Food in the Last Year: Never true    Ran Out of Food in the Last Year: Never true  Transportation Needs: No Transportation Needs (03/24/2023)   PRAPARE - Administrator, Civil Service (Medical): No    Lack of Transportation (  Non-Medical): No  Physical Activity: Not on file  Stress: Not on file  Social Connections: Unknown (01/01/2022)   Received from [facility]   Social Network    Social Network: Not on file    Family History: Family History  Problem Relation Age of Onset   Aneurysm Mother    Hypertension Father    Diabetes Father    Healthy Sister    Healthy Brother    Aneurysm Maternal Grandmother    Stroke Maternal Grandfather    Cancer Maternal Grandfather    Healthy Paternal Grandmother    Healthy Maternal Aunt     Allergies: No Known Allergies  Medications Prior to Admission  Medication Sig Dispense Refill Last Dose   Prenatal Vit-Fe Fumarate-FA (PRENATAL PO) Take 1 tablet by mouth daily.   03/23/2023     Review of Systems   All systems reviewed and negative except as stated in HPI  Blood pressure 97/61, pulse 72, temperature 98.3 F  (36.8 C), temperature source Oral, resp. rate 15, height 5' (1.524 m), weight 59.4 kg, last menstrual period 05/31/2022, SpO2 99%, not currently breastfeeding. General appearance: alert, cooperative, and no distress Lungs: no respiratory distress Abdomen: gravid Extremities: no sign of dvt Presentation: cephalic Fetal monitoringBaseline: 130 bpm, Variability: moderate, Accelerations: 15x15, and Decelerations: Absent Uterine activity irregular Dilation: 3.5 Effacement (%): 40, 50 Station: -2 Exam by:: Georgina Snell, RN   Prenatal labs: ABO, Rh: --/--/PENDING (08/05 1110) Antibody: PENDING (08/05 1110) Rubella: 4.58 (01/30 1545) RPR: Non Reactive (05/21 0911)  HBsAg: Negative (01/30 1545)  HIV: Non Reactive (05/21 0911)  GBS: Negative/-- (07/16 1011)  1 hr Glucola normal Genetic screening  normal Anatomy US normal  Prenatal Transfer Tool  Maternal Diabetes: No Genetic Screening: Normal Maternal Ultrasounds/Referrals: Normal Fetal Ultrasounds or other Referrals:  None Maternal Substance Abuse:  No Significant Maternal Medications:  None Significant Maternal Lab Results:  Group B Strep negative Number of Prenatal Visits:greater than 3 verified prenatal visits Other Comments:  None  Results for orders placed or performed during the hospital encounter of 03/24/23 (from the past 24 hour(s))  CBC   Collection Time: 03/24/23 11:10 AM  Result Value Ref Range   WBC 9.8 4.0 - 10.5 K/uL   RBC 4.25 3.87 - 5.11 MIL/uL   Hemoglobin 10.2 (L) 12.0 - 15.0 g/dL   HCT 16.1 (L) 09.6 - 04.5 %   MCV 77.2 (L) 80.0 - 100.0 fL   MCH 24.0 (L) 26.0 - 34.0 pg   MCHC 31.1 30.0 - 36.0 g/dL   RDW 40.9 81.1 - 91.4 %   Platelets 398 150 - 400 K/uL   nRBC 0.0 0.0 - 0.2 %  Type and screen [facility]   Collection Time: 03/24/23 11:10 AM  Result Value Ref Range   ABO/RH(D) PENDING    Antibody Screen PENDING    Sample Expiration      03/27/2023,2359 Performed at [facility], [address] [place], [place] [address]     Patient Active Problem List   Diagnosis Date Noted   Premature rupture of membranes 03/24/2023   Anemia during pregnancy in third trimester 02/18/2023   Encounter for supervision of normal pregnancy, antepartum 09/17/2022   Short interval between pregnancies affecting pregnancy, antepartum 09/17/2022    Assessment/Plan:  Ruther Single is a 22 y.o. G2P1001 at [redacted]w[redacted]d here for SOL  #Labor: Initial CVE 3.5/40-50/-2. SROM 0800 clear. Ctx q2 min, CTM. Consider pit at next recheck if SVE unchanged. #Pain:  Per pt request, desires epidural #FWB: Cat 1 #ID:  GBS neg #MOF: both #MOC:depo #Circ:  Yes   Kirstie Everhart, DO  03/24/2023, 12:19 PM

## 2023-03-24 NOTE — Anesthesia Preprocedure Evaluation (Signed)
Anesthesia Evaluation  Patient identified by MRN, date of birth, ID band Patient awake    Reviewed: Allergy & Precautions, Patient's Chart, lab work & pertinent test results  Airway Mallampati: I       Dental no notable dental hx.    Pulmonary    Pulmonary exam normal        Cardiovascular negative cardio ROS Normal cardiovascular exam     Neuro/Psych  Headaches  Anxiety        GI/Hepatic negative GI ROS, Neg liver ROS,,,  Endo/Other    Renal/GU negative Renal ROS     Musculoskeletal   Abdominal   Peds  Hematology   Anesthesia Other Findings   Reproductive/Obstetrics (+) Pregnancy                             Anesthesia Physical Anesthesia Plan  ASA: 2  Anesthesia Plan: Epidural   Post-op Pain Management:    Induction:   PONV Risk Score and Plan: 0  Airway Management Planned: Natural Airway  Additional Equipment:   Intra-op Plan:   Post-operative Plan:   Informed Consent: I have reviewed the patients History and Physical, chart, labs and discussed the procedure including the risks, benefits and alternatives for the proposed anesthesia with the patient or authorized representative who has indicated his/her understanding and acceptance.       Plan Discussed with:   Anesthesia Plan Comments: (Lab Results      Component                Value               Date                      WBC                      9.8                 03/24/2023                HGB                      10.2 (L)            03/24/2023                HCT                      32.8 (L)            03/24/2023                MCV                      77.2 (L)            03/24/2023                PLT                      398                 03/24/2023           )       Anesthesia Quick Evaluation

## 2023-03-24 NOTE — MAU Note (Signed)
.  Joy Dyer is a 22 y.o. at [redacted]w[redacted]d here in MAU reporting: she felt a trickle of watery fluid around 0800 this morning and has continued to leak since.  Contractions every: irregularly Onset of ctx: Today Pain Score: 7/10  ROM: Membranes Sac Identifier: Sac 1 Membrane Status: Possible ROM - for evaluation Color: Clear, Pink, Bloody Odor: Normal Amount: Moderate  Vaginal Bleeding: Vaginal Bleeding Vag. Bleeding: Bloody Show  Epidural: Planning  Fetal Movement: Reports positive FM  FHT:  Bypass triage, patient taken directly to room  There were no vitals filed for this visit.     OB Office: Faculty GBS: Negative Lab orders placed from triage: MAU Labor Eval

## 2023-03-25 ENCOUNTER — Encounter: Payer: Medicaid Other | Admitting: Obstetrics & Gynecology

## 2023-03-25 MED ORDER — DIBUCAINE (PERIANAL) 1 % EX OINT: 1.0000 | TOPICAL_OINTMENT | CUTANEOUS | Status: DC | PRN

## 2023-03-25 MED ORDER — ZOLPIDEM TARTRATE 5 MG PO TABS: 5.0000 mg | ORAL_TABLET | Freq: Every evening | ORAL | Status: DC | PRN

## 2023-03-25 MED ORDER — WITCH HAZEL-GLYCERIN EX PADS: 1.0000 | MEDICATED_PAD | CUTANEOUS | Status: DC | PRN

## 2023-03-25 MED ORDER — ONDANSETRON HCL 4 MG/2ML IJ SOLN: 4.0000 mg | INTRAMUSCULAR | Status: DC | PRN

## 2023-03-25 MED ORDER — BENZOCAINE-MENTHOL 20-0.5 % EX AERO
1.0000 | INHALATION_SPRAY | CUTANEOUS | Status: DC | PRN
  Administered 2023-03-25: 1 via TOPICAL
  Filled 2023-03-25: qty 56

## 2023-03-25 MED ORDER — COCONUT OIL OIL
1.0000 | TOPICAL_OIL | Status: DC | PRN
  Administered 2023-03-25: 1 via TOPICAL

## 2023-03-25 MED ORDER — IBUPROFEN 600 MG PO TABS
600.0000 mg | ORAL_TABLET | Freq: Four times a day (QID) | ORAL | Status: DC
  Administered 2023-03-25 (×5): 600 mg via ORAL
  Filled 2023-03-25 (×5): qty 1

## 2023-03-25 MED ORDER — SIMETHICONE 80 MG PO CHEW: 80.0000 mg | CHEWABLE_TABLET | ORAL | Status: DC | PRN

## 2023-03-25 MED ORDER — DIPHENHYDRAMINE HCL 25 MG PO CAPS: 25.0000 mg | ORAL_CAPSULE | Freq: Four times a day (QID) | ORAL | Status: DC | PRN

## 2023-03-25 MED ORDER — PRENATAL MULTIVITAMIN CH
1.0000 | ORAL_TABLET | Freq: Every day | ORAL | Status: DC
  Administered 2023-03-25: 1 via ORAL
  Filled 2023-03-25: qty 1

## 2023-03-25 MED ORDER — ACETAMINOPHEN 325 MG PO TABS: 650.0000 mg | ORAL_TABLET | ORAL | Status: DC | PRN

## 2023-03-25 MED ORDER — SENNOSIDES-DOCUSATE SODIUM 8.6-50 MG PO TABS
2.0000 | ORAL_TABLET | Freq: Every day | ORAL | Status: DC
  Administered 2023-03-25: 2 via ORAL
  Filled 2023-03-25: qty 2

## 2023-03-25 MED ORDER — TETANUS-DIPHTH-ACELL PERTUSSIS 5-2.5-18.5 LF-MCG/0.5 IM SUSY: 0.5000 mL | PREFILLED_SYRINGE | Freq: Once | INTRAMUSCULAR | Status: DC

## 2023-03-25 MED ORDER — ONDANSETRON HCL 4 MG PO TABS: 4.0000 mg | ORAL_TABLET | ORAL | Status: DC | PRN

## 2023-03-25 NOTE — Anesthesia Postprocedure Evaluation (Signed)
Anesthesia Post Note  Patient: Joy Dyer  Procedure(s) Performed: AN AD HOC LABOR EPIDURAL     Patient location during evaluation: Mother Baby Anesthesia Type: Epidural Level of consciousness: awake and alert Pain management: pain level controlled Vital Signs Assessment: post-procedure vital signs reviewed and stable Respiratory status: spontaneous breathing, nonlabored ventilation and respiratory function stable Cardiovascular status: stable Postop Assessment: no headache, no backache and epidural receding Anesthetic complications: no   No notable events documented.  Last Vitals:  Vitals:   03/25/23 0004 03/25/23 0543  BP: (!) 106/53 (!) 96/56  Pulse: 76 66  Resp: 18 18  Temp: 37.2 C 36.9 C  SpO2: 98% 100%    Last Pain:  Vitals:   03/25/23 0543  TempSrc: Oral  PainSc: 0-No pain   Pain Goal:                   [name]

## 2023-03-25 NOTE — Progress Notes (Signed)
CSW received a consult for [place] score of 12 and met MOB at bedside to offer support. CSW entered the room, introduced herself and acknowledged that FOB was present. CSW asked MOB for privacy reasons could FOB step out for the assessment; MOB was agreeable and FOB stepped out. CSW explained her role and the reason for the assessment.   CSW acknowledged [place] score of 12 and listened to MOB explore her feelings about transitioning into motherhood of 2. CSW asked MOB about her mental health history. MOB reported being diagnosed with anxiety a "few years ago". 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 MOB reported reaching out for support(PCP) and was able to begin therapy and found the support helpful. MOB reported currently feeling happy since the delivery and bonded well with the infant. CSW provided education regarding the baby blues period vs. perinatal mood disorders, discussed treatment and gave resources for mental health follow up if concerns arise.  CSW recommends self-evaluation during the postpartum time period using the New Mom Checklist from Postpartum Progress and encouraged MOB to contact a medical professional if symptoms are noted at any time. CSW assessed for safety with MOB SI/HI/DV;MOB denied all.   CSW asked MOB has she selected a pediatrician for the infant's follow up visits; MOB said [facility]. MOB reported having all essential items for the infant including a carseat and bassinet for safe sleeping. CSW provided review of Sudden Infant Death Syndrome (SIDS) precautions.  CSW identifies no further need for intervention and no barriers to discharge at this time.  Enos Fling, Theresia Majors Clinical Social Worker [phone]

## 2023-03-25 NOTE — Progress Notes (Signed)
POSTPARTUM PROGRESS NOTE  Post Partum Day 1  Subjective:  Joy Dyer is a 22 y.o. [id number] s/p SVD at [redacted]w[redacted]d.  She reports she is doing well. No acute events overnight. She denies any problems with ambulating, voiding or po intake. Denies nausea or vomiting.  Pain is well controlled.  Lochia is stable and decreasing.  Objective: Blood pressure (!) 96/56, pulse 66, temperature 98.4 F (36.9 C), temperature source Oral, resp. rate 18, height 5' (1.524 m), weight 59.4 kg, last menstrual period 05/31/2022, SpO2 100%, unknown if currently breastfeeding.  Physical Exam:  General: alert, cooperative and no distress Chest: no respiratory distress Heart:regular rate, distal pulses intact Uterine Fundus: firm, appropriately tender DVT Evaluation: No calf swelling or tenderness Extremities: No edema Skin: warm, dry  Recent Labs    03/24/23 1110  HGB 10.2*  HCT 32.8*    Assessment/Plan: Joy Dyer is a 22 y.o. [id number] s/p SVD at [redacted]w[redacted]d   PPD#1 - Doing well  Routine postpartum care  Contraception: Depo Feeding: Breast/Bottle Feeding Dispo: Plan for discharge 8/6-7.   LOS: 1 day   Olga Millers, DO PGY-3 Family Medicine 03/25/2023, 9:18 AM

## 2023-03-26 ENCOUNTER — Other Ambulatory Visit (HOSPITAL_COMMUNITY): Payer: Self-pay

## 2023-03-26 MED ORDER — ACETAMINOPHEN 325 MG PO TABS
650.0000 mg | ORAL_TABLET | ORAL | 0 refills | Status: DC | PRN
  Filled 2023-03-26: qty 100, 9d supply, fill #0

## 2023-03-26 MED ORDER — IBUPROFEN 600 MG PO TABS: 600.0000 mg | ORAL_TABLET | Freq: Four times a day (QID) | ORAL | 0 refills | Status: DC | PRN

## 2023-03-26 MED ORDER — BENZOCAINE-MENTHOL 20-0.5 % EX AERO
1.0000 | INHALATION_SPRAY | CUTANEOUS | 0 refills | Status: DC | PRN
  Filled 2023-03-26: qty 85, 30d supply, fill #0

## 2023-03-26 MED ORDER — SENNOSIDES-DOCUSATE SODIUM 8.6-50 MG PO TABS
2.0000 | ORAL_TABLET | Freq: Every day | ORAL | 0 refills | Status: AC
  Filled 2023-03-26: qty 30, 15d supply, fill #0

## 2023-03-26 NOTE — Lactation Note (Signed)
This note was copied from a baby's chart. Lactation Consultation Note  Patient Name: Joy Dyer [id number] Date: 03/26/2023 Age:21 hours  Reason for consult: Initial assessment;Early term 37-38.6wks  P2, [redacted]w[redacted]d, 1% weight loss  Mother states baby is breastfeeding well and mother is formula feeding afterwards, per choice, until milk is in. She states she hears swallows and denies any questions or concerns.  Discussed the process of milk production, supply and demand and the importance of breast stimulation and milk removal. Instructed mother to breastfeed 8-12 times in 24 hours, skin to skin, breast feed before formula feeding. Pump when baby gets a bottle supplement to establish a milk supply. Mother verbalized understanding.  She has a manual breast pump for home. Unable to be obtain a Stork Pump due to [employer]. Provided mother with resources of other DME centers that may provide. Mother also has WIC.  Mom made aware of O/P services, breastfeeding support groups, and our phone # for post-discharge questions.     Maternal Data Has patient been taught Hand Expression?: Yes Does the patient have breastfeeding experience prior to this delivery?: Yes How long did the patient breastfeed?: 3 months ( breast and formula) , other child is 21 months old  Feeding Mother's Current Feeding Choice: Breast Milk and Formula Nipple Type: Slow - flow  Interventions: Education;LC Services brochure;Hand pump  Discharge Discharge Education: Engorgement and breast care;Warning signs for feeding baby Pump: Manual WIC Program: Yes  Consult Status Consult Status: Complete    Omar Person 03/26/2023, 10:05 AM

## 2023-03-26 NOTE — Lactation Note (Signed)
This note was copied from a baby's chart. Lactation Consultation Note Mom told Nurse Tech. She didn't want to be bothered until later.   Patient Name: Joy Dyer [id number] Date: 03/26/2023 Age:21 hours     Maternal Data    Feeding    LATCH Score                    Lactation Tools Discussed/Used    Interventions    Discharge    Consult Status      Charyl Dancer 03/26/2023, 5:44 AM

## 2023-03-26 NOTE — Lactation Note (Signed)
This note was copied from a baby's chart. Lactation Consultation Note Attempted to see mom but she was resting, mom looked at [facility] when entered rm. Asked mom if she would call for [facility] for next feeding. Mom stated she was doing OK.  Patient Name: Joy Miyo Denholm [id number] Date: 03/26/2023 Age:21 hours     Maternal Data    Feeding    LATCH Score                    Lactation Tools Discussed/Used    Interventions    Discharge    Consult Status      Charyl Dancer 03/26/2023, 2:20 AM

## 2023-03-27 ENCOUNTER — Encounter: Payer: Self-pay | Admitting: Pediatrics

## 2023-03-27 ENCOUNTER — Telehealth (HOSPITAL_COMMUNITY): Payer: Self-pay | Admitting: *Deleted

## 2023-03-27 DIAGNOSIS — Z1331 Encounter for screening for depression: Secondary | ICD-10-CM

## 2023-03-27 NOTE — Telephone Encounter (Signed)
EPDS score in hospital was 12, with question ten being 0.  Placed order for IBH.  Dr. Alvester Morin notified via [facility] order.

## 2023-04-01 ENCOUNTER — Encounter: Payer: Medicaid Other | Admitting: Obstetrics and Gynecology

## 2023-04-02 NOTE — BH Specialist Note (Unsigned)
Integrated Behavioral Health via Telemedicine Visit  04/16/2023 Joy Dyer [id number]  Number of Integrated Behavioral Health Clinician visits: 1- Initial Visit  Session Start time: [phone]   Session End time: 0911  Total time in minutes: 54   Referring Provider: Federico Flake, MD Patient/Family location: Home [facility] Provider location: Center for [facility] at [facility]  All persons participating in visit: [name] and [facility] Joy Dyer   Types of Service: Individual psychotherapy and Telephone visit  I connected with Joy Dyer and/or Joy Dyer  n/a  via  Telephone or Video Enabled Telemedicine Application  (Video is Caregility application) and verified that I am speaking with the correct person using two identifiers. Discussed confidentiality: Yes   I discussed the limitations of telemedicine and the availability of in person appointments.  Discussed there is a possibility of technology failure and discussed alternative modes of communication if that failure occurs.  I discussed that engaging in this telemedicine visit, they consent to the provision of behavioral healthcare and the services will be billed under their insurance.  Patient and/or legal guardian expressed understanding and consented to Telemedicine visit: Yes   Presenting Concerns: Patient and/or family reports the following symptoms/concerns: Adjusting to two babies at home (3wo; 1yo) with fatigue, low motivation, anxiety, worry, restless, irritability, dread; overthinking, negative thinking, frustrated. Pt feels best when she's not alone or can talk to friends; after previous pregnancy, used music and outdoor walks for self-care to cope with emotions.  Duration of problem: Postpartum increase; Severity of problem: moderate  Patient and/or Family's Strengths/Protective Factors: Social connections and Sense of purpose  Goals Addressed: Patient will:   Reduce symptoms of: anxiety, depression, and stress   Increase knowledge and/or ability of: stress reduction   Demonstrate ability to: Increase healthy adjustment to current life circumstances, Increase adequate support systems for patient/family, and Increase motivation to adhere to plan of care  Progress towards Goals: Ongoing  Interventions: Interventions utilized:  Solution-Focused Strategies, Psychoeducation and/or Health Education, and Link to [employer] Standardized Assessments completed: GAD-7 and PHQ 9  Patient and/or Family Response: Patient agrees with treatment plan.   Assessment: Patient currently experiencing Adjustment disorder with mixed anxiety and depressed mood.   Patient may benefit from psychoeducation and brief therapeutic interventions regarding coping with symptoms of anxiety and depression .  Plan: Follow up with behavioral health clinician on : Two weeks Behavioral recommendations:  -Continue prioritizing healthy self-care (regular meals, adequate rest; allowing practical help from supportive friends and family) until at least postpartum medical appointment -Consider new mom support group as needed at either [email or web address].net or [email or web address]  -Consider childcare resources on After Visit Summary as needed  Referral(s): Integrated Art gallery manager (In Clinic) and [employer]:  childcare; new mom support  I discussed the assessment and treatment plan with the patient and/or parent/guardian. They were provided an opportunity to ask questions and all were answered. They agreed with the plan and demonstrated an understanding of the instructions.   They were advised to call back or seek an in-person evaluation if the symptoms worsen or if the condition fails to improve as anticipated.  Joy Lips, LCSW     04/16/2023    2:43 PM 03/04/2023    9:24 AM 01/07/2023    8:30 AM 09/17/2022    3:05 PM 05/22/2021    4:53 PM   Depression screen PHQ 2/9  Decreased Interest 2 2 3 2 2   Down, Depressed, Hopeless  1 2 2 2 2   PHQ - 2 Score 3 4 5 4 4   Altered sleeping 0 3 3 2  0  Tired, decreased energy 3 3 3 2 3   Change in appetite 0 0 0 1 3  Feeling bad or failure about yourself  1 2 2 1  0  Trouble concentrating 0 1 2 0 0  Moving slowly or fidgety/restless 0 0 0 0 0  Suicidal thoughts 0 0 0 0 0  PHQ-9 Score 7 13 15 10 10   Difficult doing work/chores  Somewhat difficult Somewhat difficult Not difficult at all       04/16/2023    2:45 PM 03/04/2023    9:25 AM 01/07/2023    8:31 AM 09/17/2022    3:05 PM  GAD 7 : Generalized Anxiety Score  Nervous, Anxious, on Edge 2 2 2 2   Control/stop worrying 1 3 2 3   Worry too much - different things 3 3 2 3   Trouble relaxing 0 3 2 2   Restless 3 3 1 1   Easily annoyed or irritable 2 3 3 3   Afraid - awful might happen 2 2 1 1   Total GAD 7 Score 13 19 13 15   Anxiety Difficulty   Somewhat difficult Not difficult at all

## 2023-04-04 ENCOUNTER — Ambulatory Visit (HOSPITAL_COMMUNITY)
Admission: EM | Admit: 2023-04-04 | Discharge: 2023-04-04 | Disposition: A | Discharge status: Home / Self Care | Payer: Medicaid Other | Attending: Emergency Medicine | Admitting: Emergency Medicine

## 2023-04-04 ENCOUNTER — Encounter (HOSPITAL_COMMUNITY): Payer: Self-pay | Admitting: *Deleted

## 2023-04-04 ENCOUNTER — Other Ambulatory Visit: Payer: Self-pay

## 2023-04-04 DIAGNOSIS — O23599 Infection of other part of genital tract in pregnancy, unspecified trimester: Secondary | ICD-10-CM | POA: Diagnosis present

## 2023-04-04 DIAGNOSIS — R300 Dysuria: Secondary | ICD-10-CM | POA: Insufficient documentation

## 2023-04-04 DIAGNOSIS — B9689 Other specified bacterial agents as the cause of diseases classified elsewhere: Secondary | ICD-10-CM | POA: Diagnosis not present

## 2023-04-04 DIAGNOSIS — N760 Acute vaginitis: Secondary | ICD-10-CM

## 2023-04-04 DIAGNOSIS — O8613 Vaginitis following delivery: Secondary | ICD-10-CM | POA: Diagnosis not present

## 2023-04-04 LAB — POCT URINALYSIS DIP (MANUAL ENTRY)
Bilirubin, UA: NEGATIVE
Glucose, UA: NEGATIVE mg/dL
Ketones, POC UA: NEGATIVE mg/dL
Leukocytes, UA: NEGATIVE
Nitrite, UA: NEGATIVE
Protein Ur, POC: NEGATIVE mg/dL
Spec Grav, UA: 1.02 (ref 1.010–1.025)
Urobilinogen, UA: 0.2 E.U./dL
pH, UA: 6 (ref 5.0–8.0)

## 2023-04-04 MED ORDER — FLUCONAZOLE 150 MG PO TABS: 150.0000 mg | ORAL_TABLET | Freq: Once | ORAL | 1 refills | Status: AC

## 2023-04-04 MED ORDER — METRONIDAZOLE 500 MG PO TABS: 500.0000 mg | ORAL_TABLET | Freq: Two times a day (BID) | ORAL | 0 refills | Status: AC

## 2023-04-04 NOTE — ED Triage Notes (Signed)
Pt is post partum birth 03-24-23. Pt has dysuria and a HA. Pt wnts to be checked fro a UTI .

## 2023-04-04 NOTE — ED Triage Notes (Signed)
PT wants to be tested for vag yeast infection and BV

## 2023-04-04 NOTE — ED Provider Notes (Signed)
HPI  SUBJECTIVE:  Joy Dyer is a 22 y.o. female who presents with dysuria starting 2 days ago.  She reports urinary urgency, dark yellow urine.  She is 11 days postpartum.  She had a normal vaginal delivery without any complications or tears.  She states that the lochia is dark brown and is slowing down.  She reports vaginal itching and thin nonodorous discharge.  No urinary frequency, cloudy odorous urine, vaginal odor, fevers, nausea, vomiting, abdominal, back, pelvic pain.  She was sent in by her OB/GYN for UTI testing.  She has tried changing her panty liner without improvement in her symptoms.  Symptoms are worse with urination.  She has a past medical history of UTI, frequent BV and yeast infections.  No history of pyelonephritis, nephrolithiasis, diabetes.  LMP: She is postpartum.  She is breast-feeding.  PCP: [facility].    Past Medical History:  Diagnosis Date   Anxiety    Exacerbated by pregnancy; also excessive by grandfather in ICU.   Headache    Irregular heartbeat 10/18/2021   During 2023 pregnancy. Seen by Cardiology. Some PACs and PVCs seen.   Near syncope 08/31/2021   During 2023 pregnancy. Seen by Cardiology. Some PACs and PVCs seen.   Systolic murmur 08/31/2021   Normal pregnancy echo 2023. Likely flow murmur    Past Surgical History:  Procedure Laterality Date   None      Family History  Problem Relation Age of Onset   Aneurysm Mother    Hypertension Father    Diabetes Father    Healthy Sister    Healthy Brother    Aneurysm Maternal Grandmother    Stroke Maternal Grandfather    Cancer Maternal Grandfather    Healthy Paternal Grandmother    Healthy Maternal Aunt     Social History   Tobacco Use   Smoking status: Never   Smokeless tobacco: Never  Vaping Use   Vaping status: Former  Substance Use Topics   Alcohol use: Not Currently   Drug use: Never    No current facility-administered medications for this  encounter.  Current Outpatient Medications:    fluconazole (DIFLUCAN) 150 MG tablet, Take 1 tablet (150 mg total) by mouth once for 1 dose. 1 tab po x 1. May repeat in 72 hours if no improvement, Disp: 2 tablet, Rfl: 1   metroNIDAZOLE (FLAGYL) 500 MG tablet, Take 1 tablet (500 mg total) by mouth 2 (two) times daily for 7 days., Disp: 14 tablet, Rfl: 0   acetaminophen (TYLENOL) 325 MG tablet, Take 2 tablets (650 mg total) by mouth every 4 (four) hours as needed (for pain scale < 4)., Disp: 100 tablet, Rfl: 0   benzocaine-Menthol (DERMOPLAST) 20-0.5 % AERO, Apply 1 Application topically as needed for irritation (perineal discomfort)., Disp: 85 g, Rfl: 0   ibuprofen (ADVIL) 600 MG tablet, Take 1 tablet (600 mg total) by mouth every 6 (six) hours as needed for mild pain, moderate pain or headache., Disp: 30 tablet, Rfl: 0   Prenatal Vit-Fe Fumarate-FA (PRENATAL PO), Take 1 tablet by mouth daily., Disp: , Rfl:    senna-docusate (SENOKOT-S) 8.6-50 MG tablet, Take 2 tablets by mouth daily., Disp: 30 tablet, Rfl: 0  No Known Allergies   ROS  As noted in HPI.   Physical Exam  BP 121/78   Pulse 70   Temp 98.3 F (36.8 C)   Resp 18   LMP 05/31/2022 Comment: live birth 03-24-23 no cycle yet.  SpO2 98%   Breastfeeding No   Constitutional: Well developed, well nourished, no acute distress Eyes:  EOMI, conjunctiva normal bilaterally HENT: Normocephalic, atraumatic,mucus membranes moist Respiratory: Normal inspiratory effort Cardiovascular: Normal rate GI: nondistended.  Soft.  No suprapubic, flank tenderness Back: No CVAT skin: No rash, skin intact Musculoskeletal: no deformities Neurologic: Alert & oriented x 3, no focal neuro deficits Psychiatric: Speech and behavior appropriate   ED Course   Medications - No data to display  Orders Placed This Encounter  Procedures   Urine Culture    Standing Status:   Standing    Number of Occurrences:   1    Order Specific Question:    Indication    Answer:   Dysuria   POCT urinalysis dipstick    Standing Status:   Standing    Number of Occurrences:   1    Results for orders placed or performed during the hospital encounter of 04/04/23 (from the past 24 hour(s))  POCT urinalysis dipstick     Status: Abnormal   Collection Time: 04/04/23 12:47 PM  Result Value Ref Range   Color, UA yellow yellow   Clarity, UA clear clear   Glucose, UA negative negative mg/dL   Bilirubin, UA negative negative   Ketones, POC UA negative negative mg/dL   Spec Grav, UA 1.610 9.604 - 1.025   Blood, UA trace-intact (A) negative   pH, UA 6.0 5.0 - 8.0   Protein Ur, POC negative negative mg/dL   Urobilinogen, UA 0.2 0.2 or 1.0 E.U./dL   Nitrite, UA Negative Negative   Leukocytes, UA Negative Negative   No results found.  ED Clinical Impression  1. Dysuria   2. Acute vaginitis   3. Vaginitis in pregnancy, postpartum      ED Assessment/Plan     Urine dip negative for UTI, however, will send this off for culture as she is so recently postpartum.  Suspect the blood is from the lochia.  Sending home with Diflucan and Flagyl as she states she gets frequent BV and yeast infections, and is at increased risk for both given that she had frequent cervical checks prior to delivery.  Deferring testing for STDs as she is at low risk and they are not a concern today.  Will hold off on treatment for UTI for now  Diflucan is considered compatible with breast-feeding when used in usual recommended doses per up-to-date.  Oral treatment with Flagyl for BV is preferred in breast-feeding.  Follow-up with OB/GYN as scheduled.  ER return precautions given.  Discussed labs, MDM, treatment plan, and plan for follow-up with patient. Discussed sn/sx that should prompt return to the ED. patient agrees with plan.   Meds ordered this encounter  Medications   metroNIDAZOLE (FLAGYL) 500 MG tablet    Sig: Take 1 tablet (500 mg total) by mouth 2 (two) times  daily for 7 days.    Dispense:  14 tablet    Refill:  0   fluconazole (DIFLUCAN) 150 MG tablet    Sig: Take 1 tablet (150 mg total) by mouth once for 1 dose. 1 tab po x 1. May repeat in 72 hours if no improvement    Dispense:  2 tablet    Refill:  1      *This clinic note was created using Scientist, clinical (histocompatibility and immunogenetics). Therefore, there may be occasional mistakes despite careful proofreading.  ?    Domenick Gong, MD 04/04/23 1308

## 2023-04-04 NOTE — Discharge Instructions (Signed)
Your urine dip is negative for urinary tract infection, but I am sending it off for culture to confirm the absence of an infection because you are so recently postpartum.  I am going to go ahead and treat you for BV and yeast.  These are safe in breast-feeding.  We will contact you if your labs are abnormal.

## 2023-04-05 LAB — URINE CULTURE: Culture: NO GROWTH

## 2023-04-07 LAB — CERVICOVAGINAL ANCILLARY ONLY
Bacterial Vaginitis (gardnerella): NEGATIVE
Candida Glabrata: NEGATIVE
Candida Vaginitis: NEGATIVE
Comment: NEGATIVE
Comment: NEGATIVE
Comment: NEGATIVE

## 2023-04-16 ENCOUNTER — Ambulatory Visit (INDEPENDENT_AMBULATORY_CARE_PROVIDER_SITE_OTHER): Payer: Medicaid Other | Admitting: Clinical

## 2023-04-16 DIAGNOSIS — F4323 Adjustment disorder with mixed anxiety and depressed mood: Secondary | ICD-10-CM

## 2023-04-16 NOTE — Patient Instructions (Signed)
[facility] at [facility] [address] [place], [place] [address] [phone] (main office) [phone] (Jamie's office)  New Parent Support Groups www.postpartum.net www.conehealthybaby.com  [place] [employer]  (Childcare options, Early childcare development, etc.) www.[place]childdev.org  [facility] Search Engine  https://ncchildcare.ncdhhs.gov/childcaresearch  

## 2023-04-17 ENCOUNTER — Telehealth (HOSPITAL_COMMUNITY): Payer: Self-pay | Admitting: *Deleted

## 2023-04-17 NOTE — Telephone Encounter (Signed)
Patient answered and requested RN call back at a later time. Deforest Hoyles, RN, 04/17/23, [phone]

## 2023-04-20 DIAGNOSIS — Z419 Encounter for procedure for purposes other than remedying health state, unspecified: Secondary | ICD-10-CM | POA: Diagnosis not present

## 2023-04-22 ENCOUNTER — Telehealth (HOSPITAL_COMMUNITY): Payer: Self-pay

## 2023-04-22 NOTE — BH Specialist Note (Signed)
Integrated Behavioral Health via Telemedicine Visit  05/05/2023 Joy Dyer [id number]  Number of Integrated Behavioral Health Clinician visits: 2- Second Visit  Session Start time: 1520   Session End time: 1546  Total time in minutes: 26   Referring Provider: Federico Flake, MD Patient/Family location: Home [facility] Provider location: [facility] for [facility] at [facility] for Women  All persons participating in visit: [name] and Joy Dyer   Types of Service: Individual psychotherapy and Video visit  I connected with Joy Dyer and/or Joy Dyer  n/a  via  Telephone or Video Enabled Telemedicine Application  (Video is Caregility application) and verified that I am speaking with the correct person using two identifiers. Discussed confidentiality: Yes   I discussed the limitations of telemedicine and the availability of in person appointments.  Discussed there is a possibility of technology failure and discussed alternative modes of communication if that failure occurs.  I discussed that engaging in this telemedicine visit, they consent to the provision of behavioral healthcare and the services will be billed under their insurance.  Patient and/or legal guardian expressed understanding and consented to Telemedicine visit: Yes   Presenting Concerns: Patient and/or family reports the following symptoms/concerns: Sickness in the household, challenge of bringing both children to ED; goal is to find childcare and work that's available within childcare schedule.  Duration of problem: Postpartum; Severity of problem: moderate  Patient and/or Family's Strengths/Protective Factors: Social connections, Concrete supports in place (healthy food, safe environments, etc.), and Sense of purpose  Goals Addressed: Patient will:  Reduce symptoms of: anxiety, depression, and stress   Increase knowledge and/or ability of: stress reduction    Demonstrate ability to: Increase healthy adjustment to current life circumstances and Increase adequate support systems for patient/family  Progress towards Goals: Ongoing  Interventions: Interventions utilized:  Solution-Focused Strategies Standardized Assessments completed: Not Needed  Patient and/or Family Response: Patient agrees with treatment plan.   Assessment: Patient currently experiencing Adjustment disorder with mixed anxious and depressed mood  Patient may benefit from continued therapeutic intervention  .  Plan: Follow up with behavioral health clinician on : Two weeks Behavioral recommendations:  -Continue prioritizing both healthy self-care (healthy, regular meals; adequate rest) and self-coping strategies (outdoor walks, music) daily as needed -Continue effort to find appropriate childcare for children; consider [employer] as additional support (ex. Resume support) -Continue considering obtaining double stroller and/or using baby wrap when bringing children places, for greater peace of mind -Continue considering new mom support as needed at www.conehealthybaby.com and/or www.postpartum.net Referral(s): Integrated Art gallery manager (In Clinic) and [employer]:  new mom support  I discussed the assessment and treatment plan with the patient and/or parent/guardian. They were provided an opportunity to ask questions and all were answered. They agreed with the plan and demonstrated an understanding of the instructions.   They were advised to call back or seek an in-person evaluation if the symptoms worsen or if the condition fails to improve as anticipated.  Joy Lips, LCSW     04/16/2023    2:43 PM 03/04/2023    9:24 AM 01/07/2023    8:30 AM 09/17/2022    3:05 PM 05/22/2021    4:53 PM  Depression screen PHQ 2/9  Decreased Interest 2 2 3 2 2   Down, Depressed, Hopeless 1 2 2 2 2   PHQ - 2 Score 3 4 5 4 4   Altered sleeping 0 3 3 2   0  Tired, decreased  energy 3 3 3 2 3   Change in appetite 0 0 0 1 3  Feeling bad or failure about yourself  1 2 2 1  0  Trouble concentrating 0 1 2 0 0  Moving slowly or fidgety/restless 0 0 0 0 0  Suicidal thoughts 0 0 0 0 0  PHQ-9 Score 7 13 15 10 10   Difficult doing work/chores  Somewhat difficult Somewhat difficult Not difficult at all       04/16/2023    2:45 PM 03/04/2023    9:25 AM 01/07/2023    8:31 AM 09/17/2022    3:05 PM  GAD 7 : Generalized Anxiety Score  Nervous, Anxious, on Edge 2 2 2 2   Control/stop worrying 1 3 2 3   Worry too much - different things 3 3 2 3   Trouble relaxing 0 3 2 2   Restless 3 3 1 1   Easily annoyed or irritable 2 3 3 3   Afraid - awful might happen 2 2 1 1   Total GAD 7 Score 13 19 13 15   Anxiety Difficulty   Somewhat difficult Not difficult at all

## 2023-04-22 NOTE — Telephone Encounter (Addendum)
04/22/2023 1307  Name: Joy Dyer MRN: [id number] DOB: 2001-04-07  Reason for Call:  Transition of Care Hospital Discharge Call  Contact Status: Patient Contact Status: Complete  Language assistant needed: Interpreter Mode: Interpreter Not Needed        Follow-Up Questions: Do You Have Any Concerns About Your Health As You Heal From Delivery?: Yes What Concerns Do You Have About Your Health?: Patient asks how long the bleeding lasts? RN reviewed normal lochia amounts, color, and duration of bleeding. Patient has no other questions or concerns about her health or healing. Do You Have Any Concerns About Your Infants Health?: No  Edinburgh Postnatal Depression Scale:  In the Past 7 Days: I have been able to laugh and see the funny side of things.: As much as I always could I have looked forward with enjoyment to things.: Rather less than I used to I have blamed myself unnecessarily when things went wrong.: Not very often I have been anxious or worried for no good reason.: Yes, sometimes I have felt scared or panicky for no good reason.: Yes, sometimes Things have been getting on top of me.: Yes, sometimes I haven't been coping as well as usual I have been so unhappy that I have had difficulty sleeping.: Not very often I have felt sad or miserable.: Yes, quite often I have been so unhappy that I have been crying.: Only occasionally The thought of harming myself has occurred to me.: Never Inocente Salles Postnatal Depression Scale Total: (!) 12  PHQ2-9 Depression Scale:     Discharge Follow-up: Edinburgh score requires follow up?: Yes Have you already been referred for a counseling appointment?: Yes (Patient has [facility] referral in place already and was seen on 04/16/23. Patient states that she has another appointment set up on 05/05/23.) Patient was advised of the following resources:: Breastfeeding Support Group, Support Group Did patient express any COVID concerns?: No  Post-discharge  interventions: Reviewed Newborn Safe Sleep Practices Maternal Mental Health Resources provided  RN told patient about Maternal Mental Health Resources ([facility], National Maternal Mental Health Hotline, Postpartum Support International, National Suicide and Crisis Lifeline). Also told patient about [facility] support group offerings. Will e-mail these resources to patient as well.  Signature  Signe Colt

## 2023-04-29 ENCOUNTER — Encounter: Payer: Self-pay | Admitting: Pediatrics

## 2023-04-29 DIAGNOSIS — Z3483 Encounter for supervision of other normal pregnancy, third trimester: Secondary | ICD-10-CM | POA: Diagnosis not present

## 2023-04-29 DIAGNOSIS — Z3482 Encounter for supervision of other normal pregnancy, second trimester: Secondary | ICD-10-CM | POA: Diagnosis not present

## 2023-05-05 ENCOUNTER — Ambulatory Visit (INDEPENDENT_AMBULATORY_CARE_PROVIDER_SITE_OTHER): Payer: Medicaid Other | Admitting: Clinical

## 2023-05-05 DIAGNOSIS — F4323 Adjustment disorder with mixed anxiety and depressed mood: Secondary | ICD-10-CM | POA: Diagnosis not present

## 2023-05-05 NOTE — Patient Instructions (Signed)
Center for [facility] at [facility] for Women [address] [place], [place] [address] [phone] (main office) [phone] (Shown Dissinger's office)  [facility] [email or web address]

## 2023-05-06 ENCOUNTER — Ambulatory Visit: Payer: Medicaid Other | Admitting: Obstetrics and Gynecology

## 2023-05-07 ENCOUNTER — Ambulatory Visit (INDEPENDENT_AMBULATORY_CARE_PROVIDER_SITE_OTHER): Payer: Medicaid Other | Admitting: Family Medicine

## 2023-05-07 ENCOUNTER — Encounter: Payer: Self-pay | Admitting: Family Medicine

## 2023-05-07 VITALS — BP 119/80 | HR 68 | Wt 115.0 lb

## 2023-05-07 DIAGNOSIS — Z30013 Encounter for initial prescription of injectable contraceptive: Secondary | ICD-10-CM | POA: Diagnosis not present

## 2023-05-07 MED ORDER — MEDROXYPROGESTERONE ACETATE 150 MG/ML IM SUSY
150.0000 mg | PREFILLED_SYRINGE | Freq: Once | INTRAMUSCULAR | Status: AC
Start: 2023-05-07 — End: 2023-05-07
  Administered 2023-05-07: 150 mg via INTRAMUSCULAR

## 2023-05-07 NOTE — Progress Notes (Signed)
Post Partum Visit Note  Joy Dyer is a 22 y.o. G70P2002 female who presents for a postpartum visit. She is 6 weeks postpartum following a normal spontaneous vaginal delivery.  I have fully reviewed the prenatal and intrapartum course. The delivery was at 38 gestational weeks.  Anesthesia: epidural. Postpartum course has been uncomplicated. Baby is doing well. Baby is feeding by bottle - Enfamil . Bleeding no bleeding. Bowel function is normal. Bladder function is normal. Patient is sexually active. Unprotected intercourse more than once last week Contraception method is  None  . Postpartum depression screening: Positive   The pregnancy intention screening data noted above was reviewed. Potential methods of contraception were discussed. The patient elected to proceed with depo.   Edinburgh Postnatal Depression Scale - 05/07/23 1134       Edinburgh Postnatal Depression Scale:  In the Past 7 Days   I have been able to laugh and see the funny side of things. 0    I have looked forward with enjoyment to things. 1    I have blamed myself unnecessarily when things went wrong. 2    I have been anxious or worried for no good reason. 3    I have felt scared or panicky for no good reason. 2    Things have been getting on top of me. 2    I have been so unhappy that I have had difficulty sleeping. 1    I have felt sad or miserable. 2    I have been so unhappy that I have been crying. 1    The thought of harming myself has occurred to me. 0    Edinburgh Postnatal Depression Scale Total 14             Health Maintenance Due  Topic Date Due   HPV VACCINES (1 - 3-dose series) Never done   DTaP/Tdap/Td (1 - Tdap) Never done   INFLUENZA VACCINE  03/20/2023   COVID-19 Vaccine (3 - 2023-24 season) 04/20/2023    The following portions of the patient's history were reviewed and updated as appropriate: allergies, current medications, past family history, past medical history, past social  history, past surgical history, and problem list.  Review of Systems Pertinent items noted in HPI and remainder of comprehensive ROS otherwise negative.  Objective:  BP 119/80   Pulse 68   Wt 115 lb (52.2 kg)   LMP 05/31/2022   Breastfeeding Yes   BMI 22.46 kg/m    General:  alert, cooperative, and appears stated age   Breasts:  not indicated  Lungs: Normal effort  Heart:  regular rate and rhythm  Abdomen: soft, non-tender; bowel sounds normal; no masses,  no organomegaly   GU exam:  not indicated       Assessment:   Normal postpartum exam.   Plan:   Essential components of care per ACOG recommendations:  1.  Mood and well being: Patient with positive depression screening today. Reviewed local resources for support. She is seeing Asher Muir, our [facility] specialist. She declines medications at this time. - Patient tobacco use? No.   - hx of drug use? No.    2. Infant care and feeding:  -Patient currently breastmilk feeding? Yes. Reviewed importance of draining breast regularly to support lactation.  -Social determinants of health (SDOH) reviewed in EPIC. No concerns  3. Sexuality, contraception and birth spacing - Patient does not want a pregnancy in the next year.  Desired family size is 2 children.  -  Reviewed reproductive life planning. Reviewed contraceptive methods based on pt preferences and effectiveness.  Patient desired Hormonal Injection today.   - Discussed birth spacing of 18 months  4. Sleep and fatigue -Encouraged family/partner/community support of 4 hrs of uninterrupted sleep to help with mood and fatigue  5. Physical Recovery  - Discussed patients delivery and complications. She describes her labor as good. - Patient had a Vaginal, no problems at delivery. Patient had no laceration. Perineal healing reviewed. Patient expressed understanding - Patient has urinary incontinence? No. - Patient is safe to resume physical and sexual activity  6.  Health  Maintenance - HM due items addressed Yes - Last pap smear  Diagnosis  Date Value Ref Range Status  09/17/2022   Final   - Negative for intraepithelial lesion or malignancy (NILM)   Pap smear not done at today's visit.  -Breast Cancer screening indicated? No.   7. Chronic Disease/Pregnancy Condition follow up: None  - PCP follow up  [name], MD Center for [facility], [facility]

## 2023-05-08 ENCOUNTER — Encounter: Payer: Self-pay | Admitting: Family Medicine

## 2023-05-08 NOTE — BH Specialist Note (Signed)
Integrated Behavioral Health via Telemedicine Visit  05/23/2023 Joy Dyer [id number]  Number of Integrated Behavioral Health Clinician visits: 3- Third Visit  Session Start time: 1416   Session End time: 1452  Total time in minutes: 36   Referring Provider: Federico Flake, MD Patient/Family location: Home [facility] Provider location: [facility] for [facility] at [facility] for Women  All persons participating in visit: [name] and Joy Dyer Joy Dyer   Types of Service: Individual psychotherapy and Video visit  I connected with Joy Dyer and/or Joy Dyer  n/a  via  Telephone or Video Enabled Telemedicine Application  (Video is Caregility application) and verified that I am speaking with the correct person using two identifiers. Discussed confidentiality: Yes   I discussed the limitations of telemedicine and the availability of in person appointments.  Discussed there is a possibility of technology failure and discussed alternative modes of communication if that failure occurs.  I discussed that engaging in this telemedicine visit, they consent to the provision of behavioral healthcare and the services will be billed under their insurance.  Patient and/or legal guardian expressed understanding and consented to Telemedicine visit: Yes   Presenting Concerns: Patient and/or family reports the following symptoms/concerns: Overwhelmed, sad, bored with same routine daily by herself; wants to get back to working out and finding balance in life; starting new part-time job has been somewhat helpful getting out of the house.  Duration of problem: Postpartum; Severity of problem: moderate  Patient and/or Family's Strengths/Protective Factors: Social connections, Concrete supports in place (healthy food, safe environments, etc.), and Sense of purpose  Goals Addressed: Patient will:  Reduce symptoms of: anxiety, depression, and stress    Increase knowledge and/or ability of: stress reduction   Demonstrate ability to: Increase healthy adjustment to current life circumstances, Increase adequate support systems for patient/family, and Increase motivation to adhere to plan of care  Progress towards Goals: Ongoing  Interventions: Interventions utilized:  Solution-Focused Strategies Standardized Assessments completed: Not Needed  Patient and/or Family Response: Patient agrees with treatment plan.   Assessment: Patient currently experiencing Adjustment disorder with mixed anxious and depressed mood.   Patient may benefit from continued therapeutic intervention  .  Plan: Follow up with behavioral health clinician on : Two weeks Behavioral recommendations:  -Continue daily healthy self-care/self-coping strategies (outdoor time and music daily) -Continue to consider obtaining double stroller and new mom support group(s) as able -Consider discussion with FOB about prioritizing and planning/scheduling time for personal time/working out without children (at least once in the next 30 days) Referral(s): Integrated Art gallery manager (In Clinic) and [employer] Resources:  New mom support/childcare/womens resource  I discussed the assessment and treatment plan with the patient and/or parent/guardian. They were provided an opportunity to ask questions and all were answered. They agreed with the plan and demonstrated an understanding of the instructions.   They were advised to call back or seek an in-person evaluation if the symptoms worsen or if the condition fails to improve as anticipated.  Rae Lips, LCSW     04/16/2023    2:43 PM 03/04/2023    9:24 AM 01/07/2023    8:30 AM 09/17/2022    3:05 PM 05/22/2021    4:53 PM  Depression screen PHQ 2/9  Decreased Interest 2 2 3 2 2   Down, Depressed, Hopeless 1 2 2 2 2   PHQ - 2 Score 3 4 5 4 4   Altered sleeping 0 3 3 2  0  Tired,  decreased energy 3 3 3 2 3   Change in  appetite 0 0 0 1 3  Feeling bad or failure about yourself  1 2 2 1  0  Trouble concentrating 0 1 2 0 0  Moving slowly or fidgety/restless 0 0 0 0 0  Suicidal thoughts 0 0 0 0 0  PHQ-9 Score 7 13 15 10 10   Difficult doing work/chores  Somewhat difficult Somewhat difficult Not difficult at all       04/16/2023    2:45 PM 03/04/2023    9:25 AM 01/07/2023    8:31 AM 09/17/2022    3:05 PM  GAD 7 : Generalized Anxiety Score  Nervous, Anxious, on Edge 2 2 2 2   Control/stop worrying 1 3 2 3   Worry too much - different things 3 3 2 3   Trouble relaxing 0 3 2 2   Restless 3 3 1 1   Easily annoyed or irritable 2 3 3 3   Afraid - awful might happen 2 2 1 1   Total GAD 7 Score 13 19 13 15   Anxiety Difficulty   Somewhat difficult Not difficult at all

## 2023-05-15 ENCOUNTER — Telehealth: Payer: Self-pay | Admitting: Lactation Services

## 2023-05-15 NOTE — Telephone Encounter (Signed)
Called patient at request of [facility] office to discuss Vaping and drinking while breast feeding. She did not answer. LM for her to call the office at [phone] to leave a message for Lactation and will call her back.

## 2023-05-15 NOTE — Telephone Encounter (Signed)
Mom returned call.   Returned call to mom.   Mom wants to know if she can drink alcohol safely while breast feeding. Reviewed can BF after 1 hour if consuming 1 glass of beer or wine and to elongate if drinking more than 1 drink. Reviewed must wait 2 hours after a drink with liquor. Reviewed if gets drunk or consumes several drinks to pump and dump for 24 hours.   Reviewed vaping and to always vape after BF or pumping as can inhibit letdown. Reviewed not to vape around baby also.   Mom voiced understanding.

## 2023-05-20 DIAGNOSIS — Z419 Encounter for procedure for purposes other than remedying health state, unspecified: Secondary | ICD-10-CM | POA: Diagnosis not present

## 2023-05-22 ENCOUNTER — Ambulatory Visit (INDEPENDENT_AMBULATORY_CARE_PROVIDER_SITE_OTHER): Payer: Medicaid Other | Admitting: Clinical

## 2023-05-22 DIAGNOSIS — F4323 Adjustment disorder with mixed anxiety and depressed mood: Secondary | ICD-10-CM | POA: Diagnosis not present

## 2023-05-23 NOTE — Patient Instructions (Signed)
Center for [facility] [address] [place], [place] [address] [phone] (main office) [phone] [facility] office)  New Parent Support Groups www.postpartum.net www.[email or web address]  Estate manager/land agent  (Childcare options, Early childcare development, etc.) DietDisorder.cz  [employer] Child Care Facility Search Engine  [email or web address]  [employer] [email or web address]

## 2023-05-26 NOTE — BH Specialist Note (Signed)
Pt did not arrive to video visit and did not answer the phone; Left HIPPA-compliant message to call back Aryahna Spagna from [facility] at  [phone] (Ilir Mahrt's office).  ?; left MyChart message for patient.  ? ?

## 2023-06-09 ENCOUNTER — Ambulatory Visit: Payer: Medicaid Other | Admitting: Clinical

## 2023-06-09 DIAGNOSIS — Z91199 Patient's noncompliance with other medical treatment and regimen due to unspecified reason: Secondary | ICD-10-CM

## 2023-06-20 DIAGNOSIS — Z419 Encounter for procedure for purposes other than remedying health state, unspecified: Secondary | ICD-10-CM | POA: Diagnosis not present

## 2023-07-10 ENCOUNTER — Encounter: Payer: Self-pay | Admitting: Obstetrics & Gynecology

## 2023-07-10 ENCOUNTER — Other Ambulatory Visit: Payer: Self-pay | Admitting: *Deleted

## 2023-07-10 MED ORDER — DROSPIRENONE-ETHINYL ESTRADIOL 3-0.02 MG PO TABS: 1.0000 | ORAL_TABLET | Freq: Every day | ORAL | 11 refills | Status: AC

## 2023-07-11 ENCOUNTER — Other Ambulatory Visit (HOSPITAL_COMMUNITY): Payer: Self-pay

## 2023-07-20 DIAGNOSIS — Z419 Encounter for procedure for purposes other than remedying health state, unspecified: Secondary | ICD-10-CM | POA: Diagnosis not present

## 2023-07-24 DIAGNOSIS — R112 Nausea with vomiting, unspecified: Secondary | ICD-10-CM | POA: Diagnosis not present

## 2023-07-24 DIAGNOSIS — R059 Cough, unspecified: Secondary | ICD-10-CM | POA: Diagnosis not present

## 2023-07-24 DIAGNOSIS — J029 Acute pharyngitis, unspecified: Secondary | ICD-10-CM | POA: Diagnosis not present

## 2023-07-24 DIAGNOSIS — B349 Viral infection, unspecified: Secondary | ICD-10-CM | POA: Diagnosis not present

## 2023-08-12 IMAGING — US US ABDOMEN LIMITED
1 series · 15 of 25 positions shown · non-contrast
Comparison: None.

CLINICAL DATA: Upper abdominal pain

EXAM:
ULTRASOUND ABDOMEN LIMITED RIGHT UPPER QUADRANT

[Series 1: us abdomen limited · 15 of 43 slices shown]
[im 1/43]
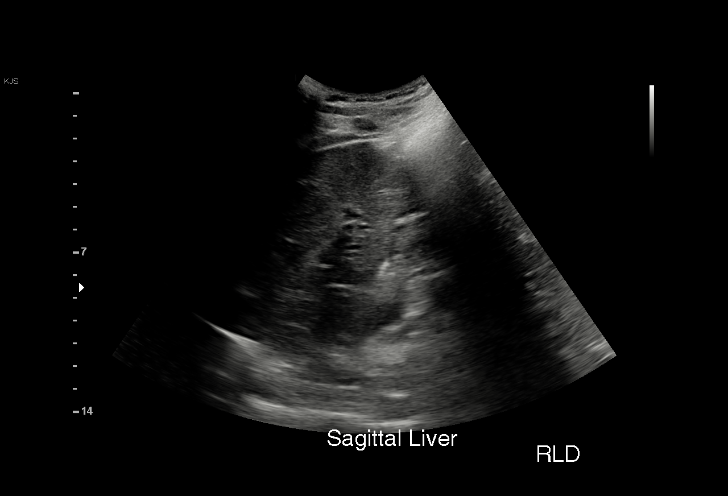
[im 4/43]
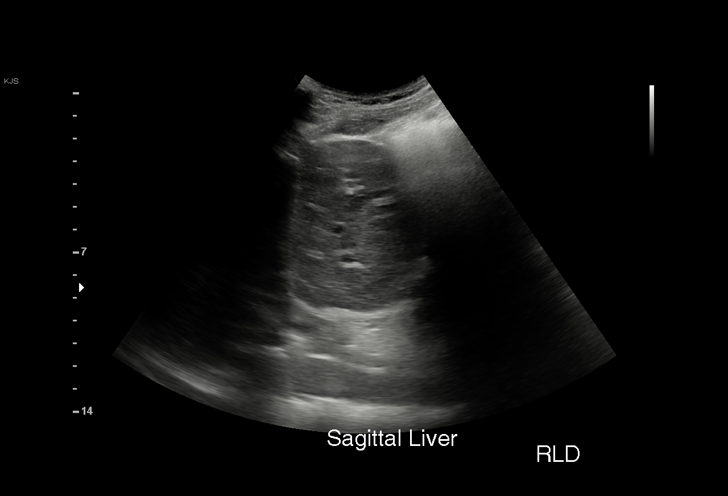
[im 8/43]
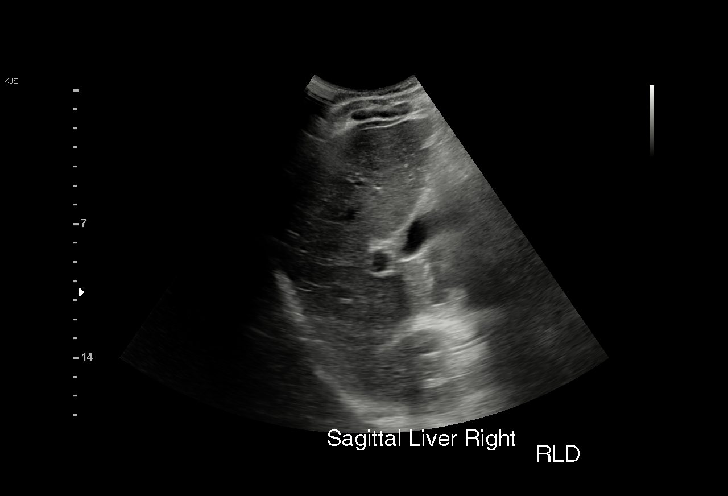
[im 9/43]
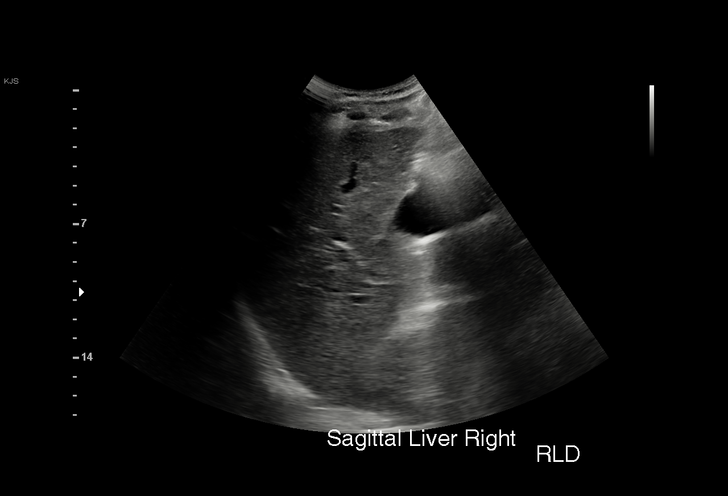
[im 13/43]
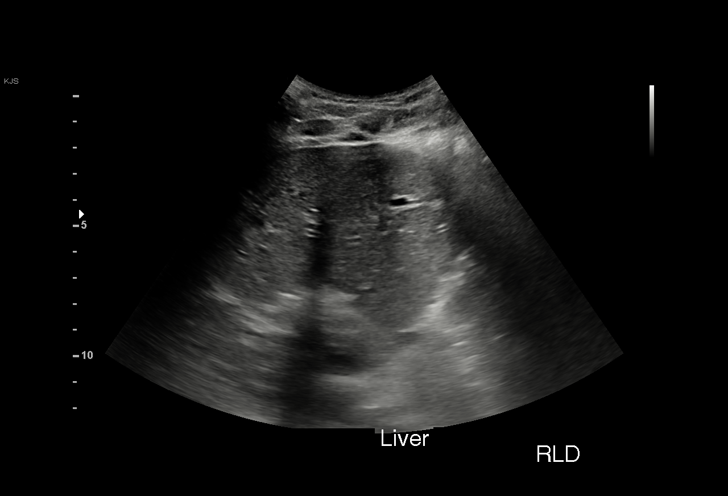
[im 16/43]
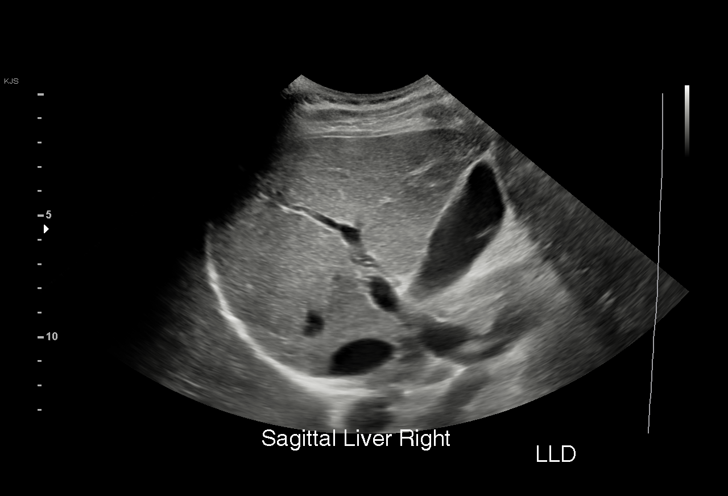
[im 18/43]
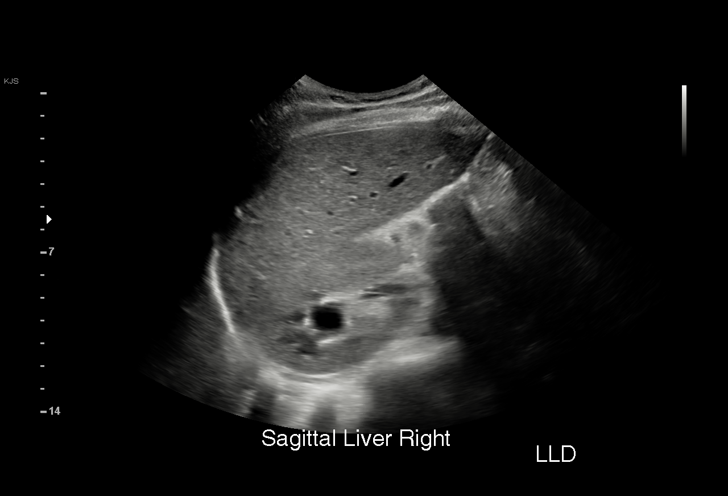
[im 22/43]
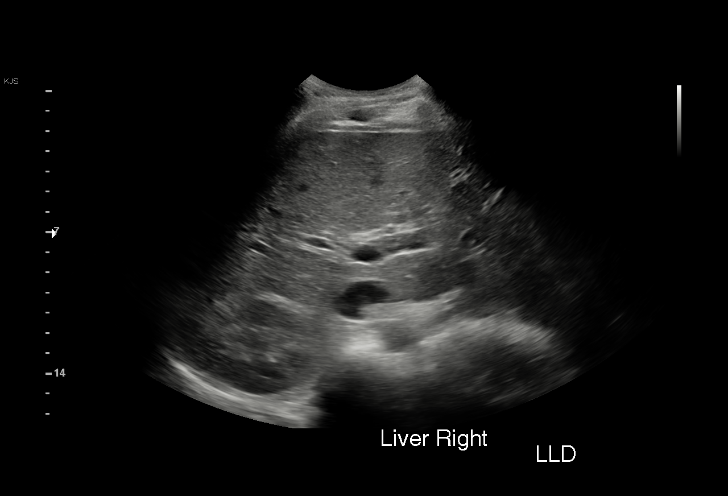
[im 25/43]
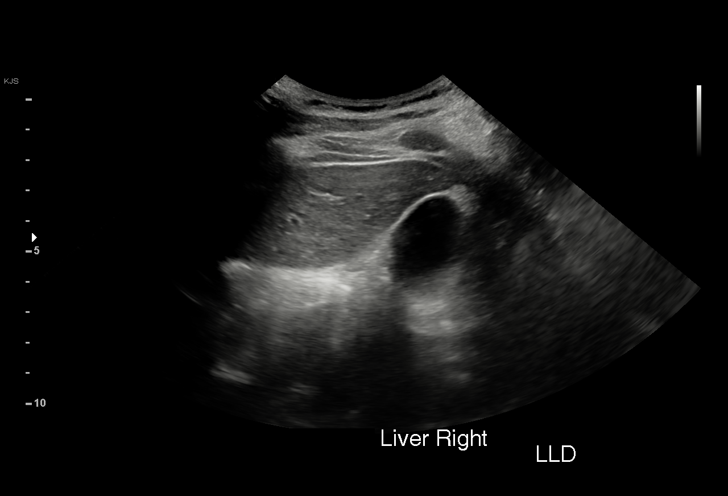
[im 27/43]
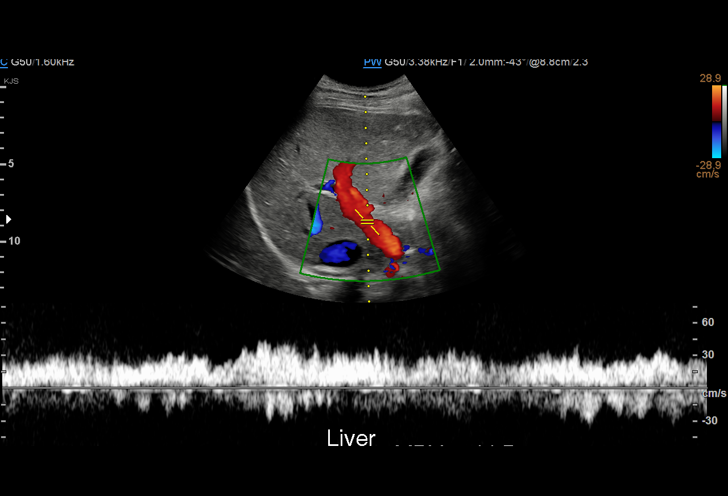
[im 30/43]
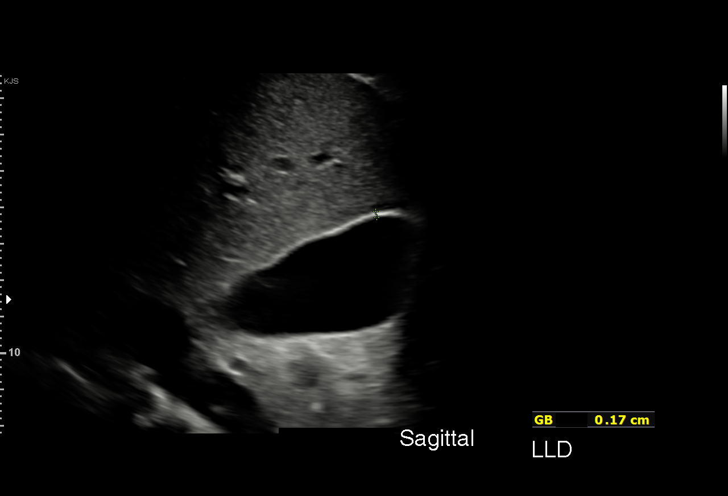
[im 34/43]
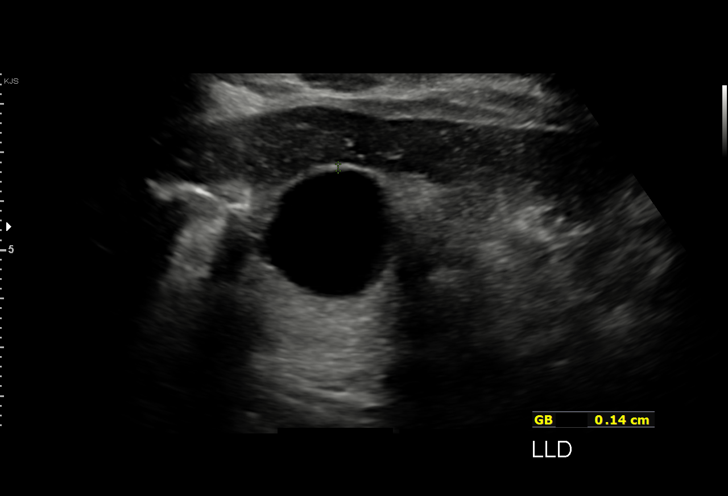
[im 36/43]
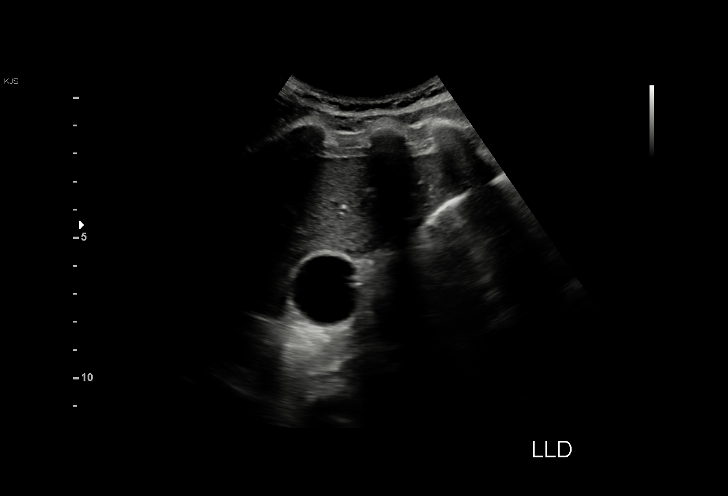
[im 39/43]
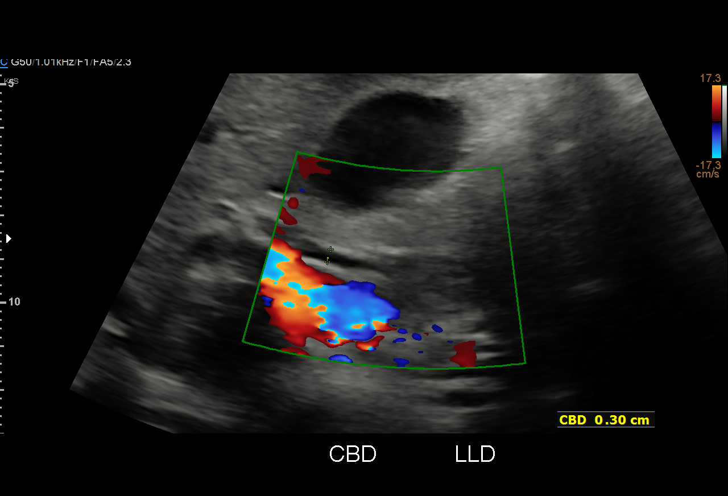
[im 43/43]
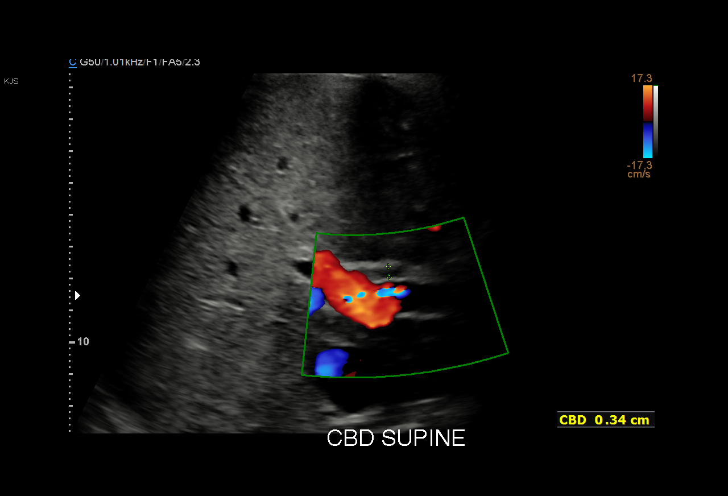

[15 of 25 positions shown; findings below may reference images not displayed]

FINDINGS: Gallbladder:

No gallstones or wall thickening visualized. No sonographic Murphy
sign noted by sonographer.

Common bile duct:

Diameter: 3.4 mm

Liver:

No focal lesion identified. Within normal limits in parenchymal
echogenicity. Portal vein is patent on color Doppler imaging with
normal direction of blood flow towards the liver.

Other: Technically difficult exam due to patient
movement/discomfort.
IMPRESSION: Normal right upper quadrant ultrasound.

## 2023-08-12 IMAGING — MR MR PELVIS W/O CM
5 series · 36 of 48 positions shown · non-contrast
Comparison: Ultrasound 11/14/2021.

CLINICAL DATA: Right lower quadrant abdominal pain.

EXAM:
MRI ABDOMEN WITHOUT CONTRAST
TECHNIQUE: Multiplanar multisequence MR imaging was performed without the
administration of intravenous contrast.

[Series 3: cor haste · coronal · 5.0mm · 1.09mm/px · 9 of 48 slices shown]
[im 1/48]
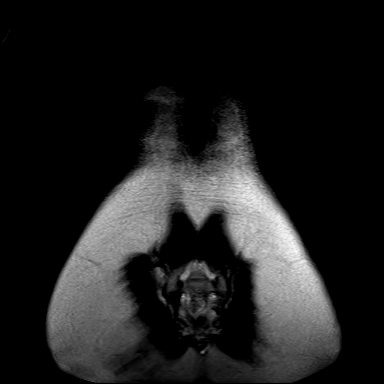
[im 8/48]
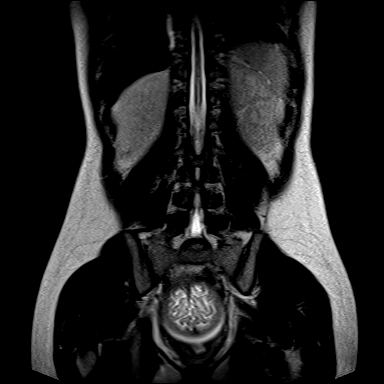
[im 16/48]
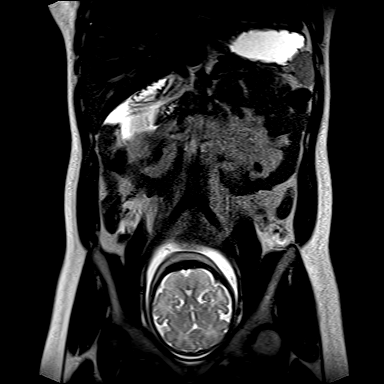
[im 20/48]
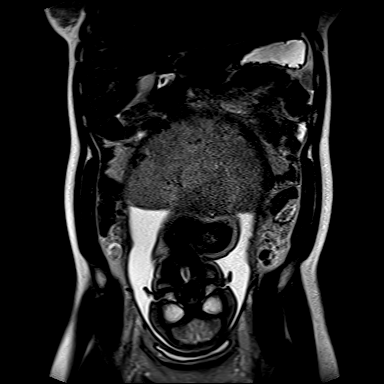
[im 24/48]
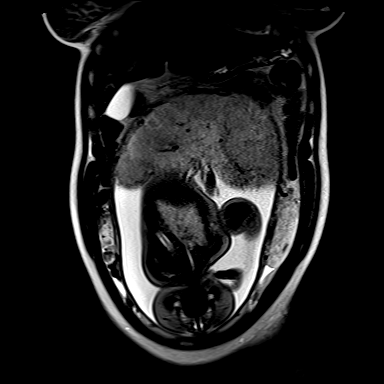
[im 28/48]
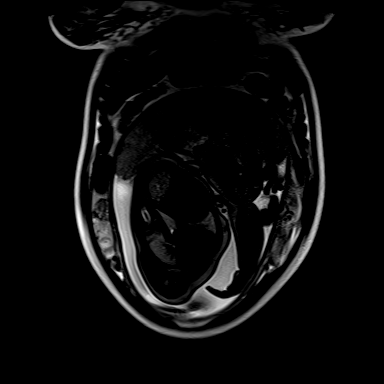
[im 32/48]
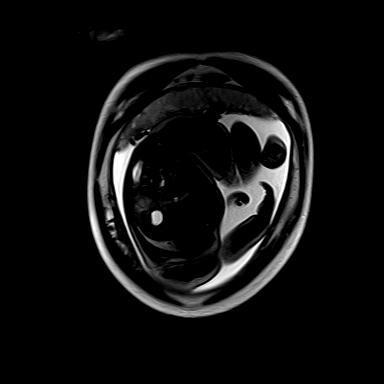
[im 40/48]
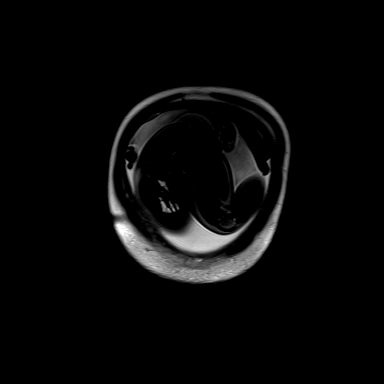
[im 48/48]
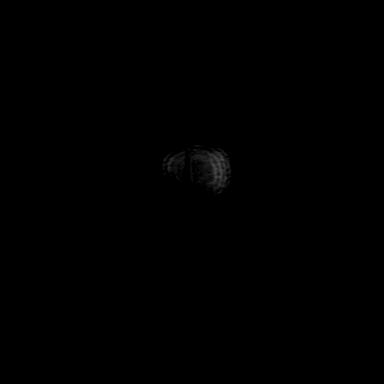

[Series 6: cor haste fs · coronal · 5.0mm · 1.04mm/px · 9 of 48 slices shown]
[im 1/48]
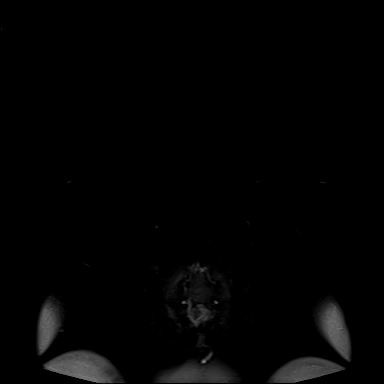
[im 8/48]
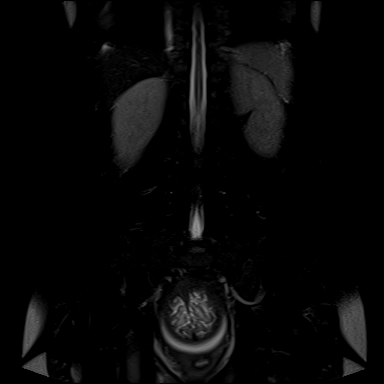
[im 16/48]
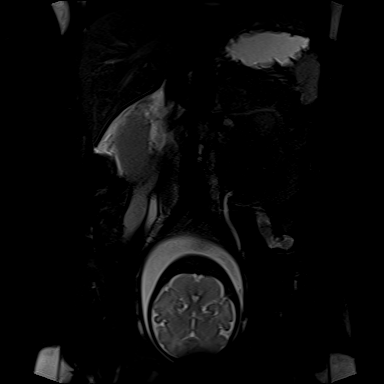
[im 20/48]
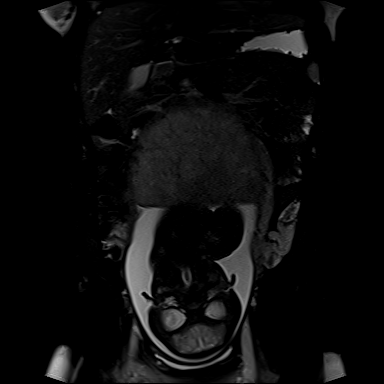
[im 24/48]
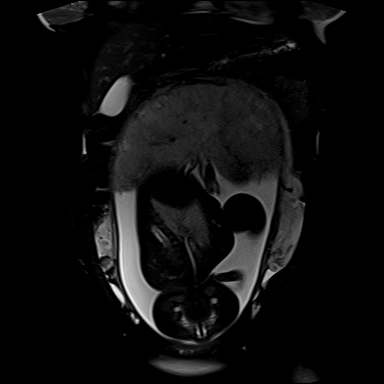
[im 28/48]
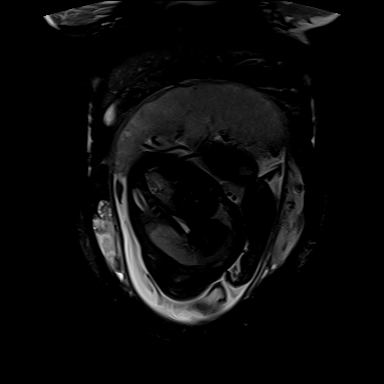
[im 32/48]
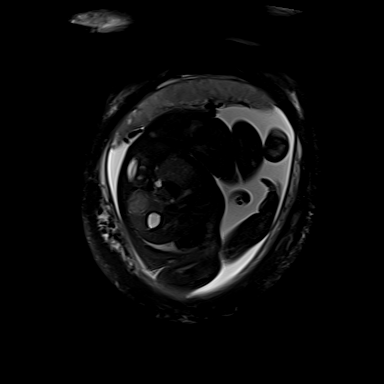
[im 40/48]
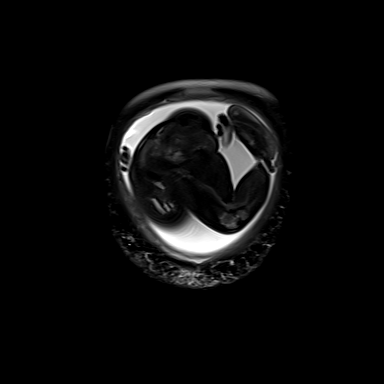
[im 48/48]
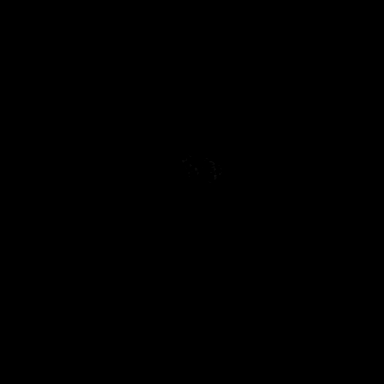

[Series 11: ax haste_comp · axial · 5.0mm · 0.99mm/px · z∈[+2,+188]mm · 8 of 32 slices shown (1 of 2)]
[im 1/32]
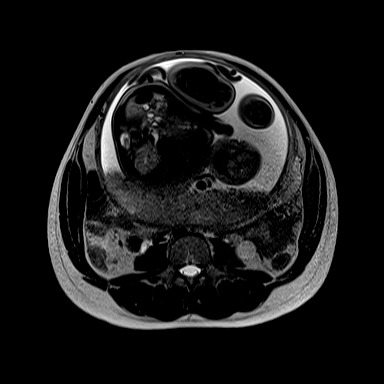
[im 5/32]
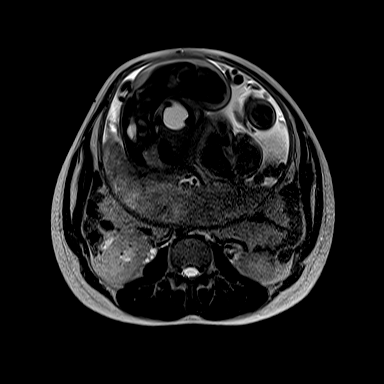
[im 9/32]
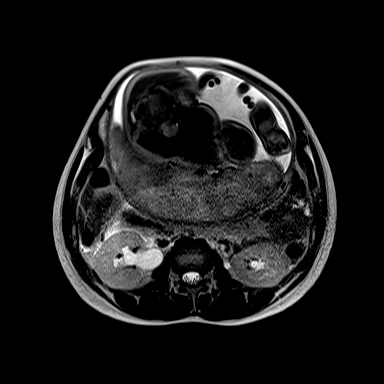
[im 14/32]
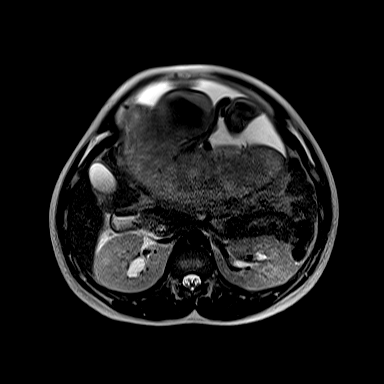
[im 18/32]
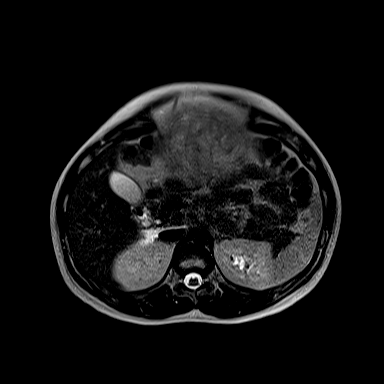
[im 23/32]
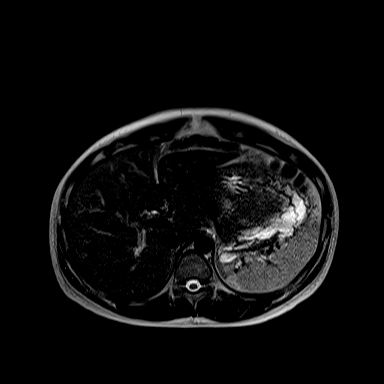
[im 27/32]
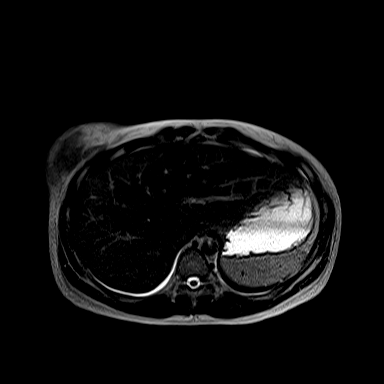
[im 32/32]
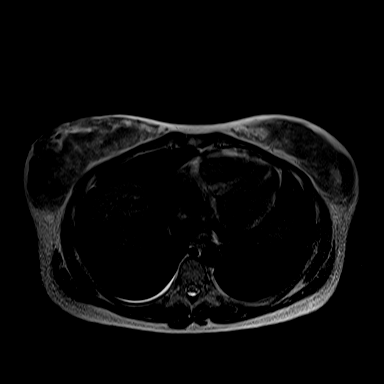

[Series 11: ax haste_comp · axial · 5.0mm · 1.19mm/px · z∈[-211,-1]mm · 9 of 36 slices shown (2 of 2)]
[im 1/36]
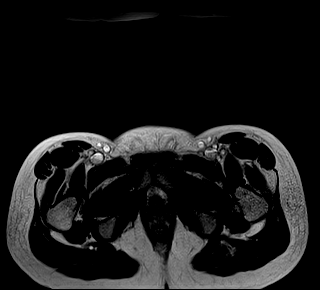
[im 5/36]
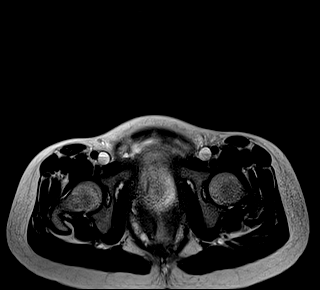
[im 9/36]
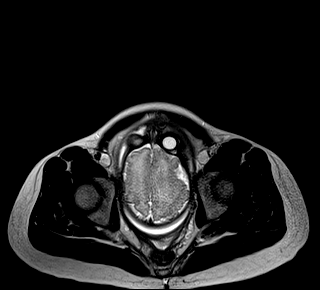
[im 14/36]
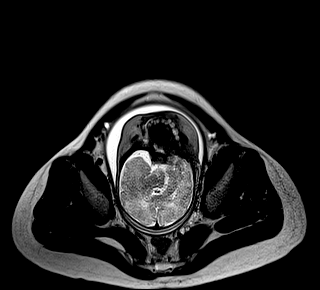
[im 18/36]
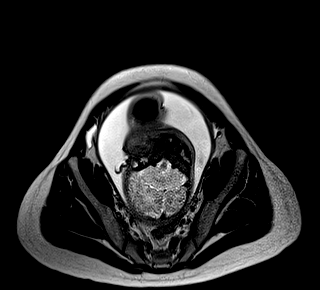
[im 22/36]
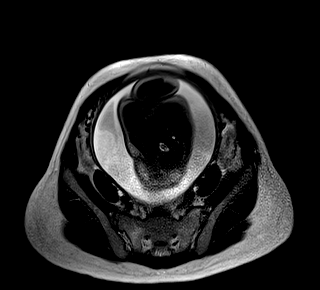
[im 27/36]
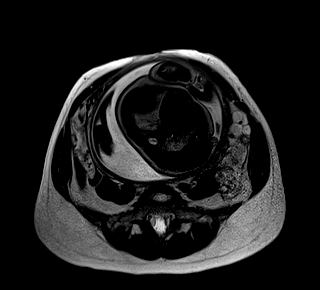
[im 31/36]
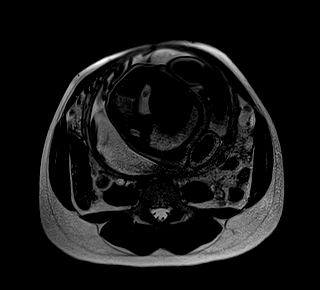
[im 36/36]
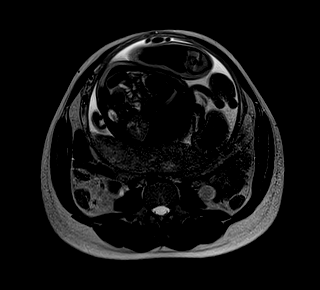

[Series 13: ax haste fs · axial · 5.0mm · 0.99mm/px · 1 of 20 slices shown]
[im 1/20]
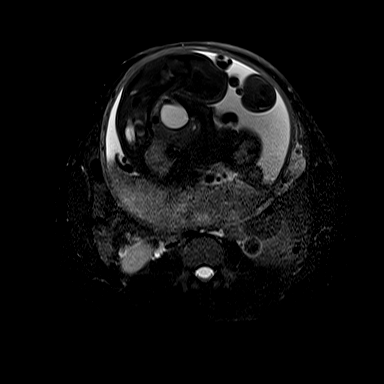

[36 of 48 positions shown; findings below may reference images not displayed]

FINDINGS: Examination is somewhat limited. The patient was in a lot of pain
and could not complete the examination.

Lower chest: The visualized lung bases are grossly clear. No pleural
effusion.

Hepatobiliary: No hepatic lesions or intrahepatic biliary
dilatation. The gallbladder appears normal. Normal caliber and
course of the common bile duct.

Pancreas:  No mass, inflammation or ductal dilatation.

Spleen:  Normal size.  No focal lesions.

Adrenals/Urinary Tract:  The adrenal glands are unremarkable.

Moderate to marked right-sided hydronephrosis and mild to moderate
left hydronephrosis. There is also right-sided perinephric fluid and
fluid in the hepatic renal fossa.

Stomach/Bowel: Stomach, duodenum, small bowel and colon grossly. Do
not see any findings suspicious for acute appendicitis.

Vascular/Lymphatic: No significant findings. Expected prominent
gonadal veins.

Other: Gravid uterus with advanced age fetus. Normal appearance of
the posterior placenta and normal appearing amniotic volume
subjectively. No gross fetal abnormalities.

Scattered free abdominopelvic fluid is noted.

Musculoskeletal: No significant bony findings.
IMPRESSION: 1. Limited examination.
2. Moderate to marked right-sided hydronephrosis and mild to
moderate left hydronephrosis. There is also right-sided perinephric
fluid and fluid in the hepatorenal fossa.
3. No findings suspicious for acute appendicitis.
4. Gravid uterus with advanced age fetus. Normal appearance of the
posterior placenta and normal appearing amniotic volume
subjectively.
5. Scattered free abdominopelvic fluid.

## 2023-08-18 DIAGNOSIS — H5213 Myopia, bilateral: Secondary | ICD-10-CM | POA: Diagnosis not present

## 2023-08-19 ENCOUNTER — Encounter: Payer: Self-pay | Admitting: Obstetrics & Gynecology

## 2023-08-19 ENCOUNTER — Telehealth: Payer: Medicaid Other | Admitting: Obstetrics & Gynecology

## 2023-08-19 DIAGNOSIS — Z3041 Encounter for surveillance of contraceptive pills: Secondary | ICD-10-CM | POA: Diagnosis not present

## 2023-08-19 NOTE — Progress Notes (Signed)
 GYNECOLOGY VIRTUAL VISIT ENCOUNTER NOTE  Provider location: Center for [facility]   Patient location: Home  I connected with Shona Clemetine Gentry on 08/19/23 at  1:50 PM EST by MyChart Video Encounter and verified that I am speaking with the correct person using two identifiers.   I discussed the limitations, risks, security and privacy concerns of performing an evaluation and management service virtually and the availability of in person appointments. I also discussed with the patient that there may be a patient responsible charge related to this service. The patient expressed understanding and agreed to proceed.   History:  Joy Dyer is a 22 y.o. [phone] female being evaluated today for recent initiation of OCPs after having significant breakthrough bleeding on Depo Provera  that she received postpartum on 05/07/2023. She stopped bleeding on the prescribed Yaz.  She denies any current abnormal vaginal discharge, bleeding, pelvic pain or other concerns.       Past Medical History:  Diagnosis Date   Anxiety    Exacerbated by pregnancy; also excessive by grandfather in ICU.   Headache    Irregular heartbeat 10/18/2021   During 2023 pregnancy. Seen by Cardiology. Some PACs and PVCs seen.   Near syncope 08/31/2021   During 2023 pregnancy. Seen by Cardiology. Some PACs and PVCs seen.   Systolic murmur 08/31/2021   Normal pregnancy echo 2023. Likely flow murmur   Past Surgical History:  Procedure Laterality Date   None     The following portions of the patient's history were reviewed and updated as appropriate: allergies, current medications, past family history, past medical history, past social history, past surgical history and problem list.   Health Maintenance:  Normal pap on 08/21/2022.   Review of Systems:  Pertinent items noted in HPI and remainder of comprehensive ROS otherwise negative.  Physical Exam:   General:  Alert, oriented and cooperative.  Patient appears to be in no acute distress.  Mental Status: Normal mood and affect. Normal behavior. Normal judgment and thought content.   Respiratory: Normal respiratory effort, no problems with respiration noted  Rest of physical exam deferred due to type of encounter    Assessment and Plan:     1. Oral contraceptive pill surveillance (Primary) Discussed with patient that Depo Provera  and LARCs can be associated with 3-6 months of irregular bleeding, followed by lighter periods or no periods.  This was expected, emphasized increased contraceptive efficacy of those modalities.  She does not want to continue with Depo Provera , just wants Yaz.  She was told to continue taking daily as recommended.  She will take a BP soon and send via MyChart, to make sure her BP is stable (no history of HTN disorders, this is just part of OCP check up).  She has enough refills for her Francia for the year.     I discussed the assessment and treatment plan with the patient. The patient was provided an opportunity to ask questions and all were answered. The patient agreed with the plan and demonstrated an understanding of the instructions.   The patient was advised to call back or seek an in-person evaluation/go to the ED if the symptoms worsen or if the condition fails to improve as anticipated.  I provided 7 minutes of face-to-face time during this encounter; about 9 minutes was dedicated to the non face-to-face time care of this patient including pre-visit review of records and documenting this visit encounter.    Gloris Hugger, MD Center  for [employer], [facility]

## 2023-08-20 DIAGNOSIS — Z419 Encounter for procedure for purposes other than remedying health state, unspecified: Secondary | ICD-10-CM | POA: Diagnosis not present

## 2023-08-31 DIAGNOSIS — Z419 Encounter for procedure for purposes other than remedying health state, unspecified: Secondary | ICD-10-CM | POA: Diagnosis not present

## 2023-09-20 DIAGNOSIS — Z419 Encounter for procedure for purposes other than remedying health state, unspecified: Secondary | ICD-10-CM | POA: Diagnosis not present

## 2023-09-20 DIAGNOSIS — Z87891 Personal history of nicotine dependence: Secondary | ICD-10-CM | POA: Diagnosis not present

## 2023-09-20 DIAGNOSIS — Z0000 Encounter for general adult medical examination without abnormal findings: Secondary | ICD-10-CM | POA: Diagnosis not present

## 2023-09-20 DIAGNOSIS — Z1389 Encounter for screening for other disorder: Secondary | ICD-10-CM | POA: Diagnosis not present

## 2023-10-01 DIAGNOSIS — Z419 Encounter for procedure for purposes other than remedying health state, unspecified: Secondary | ICD-10-CM | POA: Diagnosis not present

## 2023-10-13 DIAGNOSIS — R197 Diarrhea, unspecified: Secondary | ICD-10-CM | POA: Diagnosis not present

## 2023-10-13 DIAGNOSIS — R112 Nausea with vomiting, unspecified: Secondary | ICD-10-CM | POA: Diagnosis not present

## 2023-10-17 ENCOUNTER — Encounter: Payer: Self-pay | Admitting: Obstetrics & Gynecology

## 2023-10-18 DIAGNOSIS — Z419 Encounter for procedure for purposes other than remedying health state, unspecified: Secondary | ICD-10-CM | POA: Diagnosis not present

## 2023-10-29 DIAGNOSIS — Z419 Encounter for procedure for purposes other than remedying health state, unspecified: Secondary | ICD-10-CM | POA: Diagnosis not present

## 2023-11-29 DIAGNOSIS — Z419 Encounter for procedure for purposes other than remedying health state, unspecified: Secondary | ICD-10-CM | POA: Diagnosis not present

## 2023-12-29 DIAGNOSIS — Z419 Encounter for procedure for purposes other than remedying health state, unspecified: Secondary | ICD-10-CM | POA: Diagnosis not present

## 2024-01-29 DIAGNOSIS — Z419 Encounter for procedure for purposes other than remedying health state, unspecified: Secondary | ICD-10-CM | POA: Diagnosis not present

## 2024-02-28 DIAGNOSIS — L309 Dermatitis, unspecified: Secondary | ICD-10-CM | POA: Diagnosis not present

## 2024-02-28 DIAGNOSIS — Z419 Encounter for procedure for purposes other than remedying health state, unspecified: Secondary | ICD-10-CM | POA: Diagnosis not present

## 2024-03-30 DIAGNOSIS — Z419 Encounter for procedure for purposes other than remedying health state, unspecified: Secondary | ICD-10-CM | POA: Diagnosis not present

## 2024-04-30 DIAGNOSIS — Z419 Encounter for procedure for purposes other than remedying health state, unspecified: Secondary | ICD-10-CM | POA: Diagnosis not present

## 2024-06-08 DIAGNOSIS — N898 Other specified noninflammatory disorders of vagina: Secondary | ICD-10-CM | POA: Diagnosis not present
